# Patient Record
Sex: Female | Born: 1990 | Race: White | Hispanic: No | Marital: Single | Smoking: Never smoker
Health system: Southern US, Community
[De-identification: ages and names within clinical notes are randomized; demographics above are authoritative.]

## PROBLEM LIST (undated history)

## (undated) ENCOUNTER — Inpatient Hospital Stay (HOSPITAL_COMMUNITY): Payer: Self-pay

## (undated) DIAGNOSIS — M940 Chondrocostal junction syndrome [Tietze]: Secondary | ICD-10-CM

## (undated) DIAGNOSIS — B009 Herpesviral infection, unspecified: Secondary | ICD-10-CM

## (undated) DIAGNOSIS — F419 Anxiety disorder, unspecified: Secondary | ICD-10-CM

## (undated) DIAGNOSIS — R0789 Other chest pain: Secondary | ICD-10-CM

## (undated) DIAGNOSIS — L609 Nail disorder, unspecified: Secondary | ICD-10-CM

## (undated) DIAGNOSIS — R519 Headache, unspecified: Secondary | ICD-10-CM

## (undated) HISTORY — DX: Herpesviral infection, unspecified: B00.9

## (undated) HISTORY — DX: Headache, unspecified: R51.9

## (undated) HISTORY — DX: Anxiety disorder, unspecified: F41.9

## (undated) HISTORY — DX: Nail disorder, unspecified: L60.9

## (undated) HISTORY — DX: Other chest pain: R07.89

## (undated) HISTORY — PX: NO PAST SURGERIES: SHX2092

## (undated) HISTORY — DX: Chondrocostal junction syndrome (tietze): M94.0

---

## 2013-03-23 ENCOUNTER — Encounter (HOSPITAL_COMMUNITY): Payer: Self-pay | Admitting: Family Medicine

## 2013-03-23 ENCOUNTER — Emergency Department (HOSPITAL_COMMUNITY)
Admission: EM | Admit: 2013-03-23 | Discharge: 2013-03-24 | Disposition: A | Discharge status: Home / Self Care | Payer: BC Managed Care – PPO | Attending: Emergency Medicine | Admitting: Emergency Medicine

## 2013-03-23 DIAGNOSIS — Y939 Activity, unspecified: Secondary | ICD-10-CM | POA: Insufficient documentation

## 2013-03-23 DIAGNOSIS — S81812A Laceration without foreign body, left lower leg, initial encounter: Secondary | ICD-10-CM

## 2013-03-23 DIAGNOSIS — W1809XA Striking against other object with subsequent fall, initial encounter: Secondary | ICD-10-CM | POA: Insufficient documentation

## 2013-03-23 DIAGNOSIS — Z79899 Other long term (current) drug therapy: Secondary | ICD-10-CM | POA: Insufficient documentation

## 2013-03-23 DIAGNOSIS — Y929 Unspecified place or not applicable: Secondary | ICD-10-CM | POA: Insufficient documentation

## 2013-03-23 DIAGNOSIS — S81009A Unspecified open wound, unspecified knee, initial encounter: Secondary | ICD-10-CM | POA: Insufficient documentation

## 2013-03-24 MED ORDER — ACETAMINOPHEN 325 MG PO TABS
650.0000 mg | ORAL_TABLET | Freq: Once | ORAL | Status: AC
  Administered 2013-03-24: 650 mg via ORAL
  Filled 2013-03-24: qty 2

## 2013-03-24 NOTE — Discharge Instructions (Signed)
Take tylenol or motrin as needed for pain in your leg.  Keep wound clean and dry.  Follow up with your primary care doctor or [facility] [phone]; [address]) in 7-10 days for wound recheck and suture removal.  You should be seen sooner if you develop fever, worsening pain or redness/drainage of pus at site of wound.    Laceration Care, Adult A laceration is a cut or lesion that goes through all layers of the skin and into the tissue just beneath the skin. TREATMENT  Some lacerations may not require closure. Some lacerations may not be able to be closed due to an increased risk of infection. It is important to see your caregiver as soon as possible after an injury to minimize the risk of infection and maximize the opportunity for successful closure. If closure is appropriate, pain medicines may be given, if needed. The wound will be cleaned to help prevent infection. Your caregiver will use stitches (sutures), staples, wound glue (adhesive), or skin adhesive strips to repair the laceration. These tools bring the skin edges together to allow for faster healing and a better cosmetic outcome. However, all wounds will heal with a scar. Once the wound has healed, scarring can be minimized by covering the wound with sunscreen during the day for 1 full year. HOME CARE INSTRUCTIONS  For sutures or staples:  Keep the wound clean and dry.  If you were given a bandage (dressing), you should change it at least once a day. Also, change the dressing if it becomes wet or dirty, or as directed by your caregiver.  Wash the wound with soap and water 2 times a day. Rinse the wound off with water to remove all soap. Pat the wound dry with a clean towel.  After cleaning, apply a thin layer of the antibiotic ointment as recommended by your caregiver. This will help prevent infection and keep the dressing from sticking.  You may shower as usual after the first 24 hours. Do not soak the wound in water  until the sutures are removed.  Only take over-the-counter or prescription medicines for pain, discomfort, or fever as directed by your caregiver.  Get your sutures or staples removed as directed by your caregiver. For skin adhesive strips:  Keep the wound clean and dry.  Do not get the skin adhesive strips wet. You may bathe carefully, using caution to keep the wound dry.  If the wound gets wet, pat it dry with a clean towel.  Skin adhesive strips will fall off on their own. You may trim the strips as the wound heals. Do not remove skin adhesive strips that are still stuck to the wound. They will fall off in time. For wound adhesive:  You may briefly wet your wound in the shower or bath. Do not soak or scrub the wound. Do not swim. Avoid periods of heavy perspiration until the skin adhesive has fallen off on its own. After showering or bathing, gently pat the wound dry with a clean towel.  Do not apply liquid medicine, cream medicine, or ointment medicine to your wound while the skin adhesive is in place. This may loosen the film before your wound is healed.  If a dressing is placed over the wound, be careful not to apply tape directly over the skin adhesive. This may cause the adhesive to be pulled off before the wound is healed.  Avoid prolonged exposure to sunlight or tanning lamps while the skin adhesive  is in place. Exposure to ultraviolet light in the first year will darken the scar.  The skin adhesive will usually remain in place for 5 to 10 days, then naturally fall off the skin. Do not pick at the adhesive film. You may need a tetanus shot if:  You cannot remember when you had your last tetanus shot.  You have never had a tetanus shot. If you get a tetanus shot, your arm may swell, get red, and feel warm to the touch. This is common and not a problem. If you need a tetanus shot and you choose not to have one, there is a rare chance of getting tetanus. Sickness from tetanus can  be serious. SEEK MEDICAL CARE IF:   You have redness, swelling, or increasing pain in the wound.  You see a red line that goes away from the wound.  You have yellowish-white fluid (pus) coming from the wound.  You have a fever.  You notice a bad smell coming from the wound or dressing.  Your wound breaks open before or after sutures have been removed.  You notice something coming out of the wound such as wood or glass.  Your wound is on your hand or foot and you cannot move a finger or toe. SEEK IMMEDIATE MEDICAL CARE IF:   Your pain is not controlled with prescribed medicine.  You have severe swelling around the wound causing pain and numbness or a change in color in your arm, hand, leg, or foot.  Your wound splits open and starts bleeding.  You have worsening numbness, weakness, or loss of function of any joint around or beyond the wound.  You develop painful lumps near the wound or on the skin anywhere on your body. MAKE SURE YOU:   Understand these instructions.  Will watch your condition.  Will get help right away if you are not doing well or get worse. Document Released: 06/25/2005 Document Revised: 09/17/2011 Document Reviewed: 12/19/2010 [facility] Patient Information 2014 [place], [place].

## 2013-03-24 NOTE — ED Provider Notes (Signed)
CSN: [id number]     Arrival date & time 03/23/13  2043 History   None    Chief Complaint  Patient presents with  . Leg Injury   (Consider location/radiation/quality/duration/timing/severity/associated sxs/prior Treatment) HPI History provided by pt.   Pt stepped in a hole on campus this evening, fell forward and her L shin hit concrete step.  Sustained a laceration.  C/o moderate pain that is aggravated by palpation.  Has not attempted to bear weight.  Has not taken anything for pain or cleaned out wound.  No associated paresthesias.  Tetanus up to date.  History reviewed. No pertinent past medical history. History reviewed. No pertinent past surgical history. No family history on file. History  Substance Use Topics  . Smoking status: Never Smoker   . Smokeless tobacco: Not on file  . Alcohol Use: Yes     Comment: occasionally   OB History   Grav Para Term Preterm Abortions TAB SAB Ect Mult Living                 Review of Systems  All other systems reviewed and are negative.    Allergies  Mango flavor  Home Medications   Current Outpatient Rx  Name  Route  Sig  Dispense  Refill  . norgestimate-ethinyl estradiol (ORTHO-CYCLEN,SPRINTEC,PREVIFEM) 0.25-35 MG-MCG tablet   Oral   Take 1 tablet by mouth daily.          BP 134/82  Pulse 65  Temp(Src) 98.8 F (37.1 C) (Oral)  Resp 16  Wt 123 lb (55.792 kg)  SpO2 100%  LMP 03/09/2013 Physical Exam  Nursing note and vitals reviewed. Constitutional: She is oriented to person, place, and time. She appears well-developed and well-nourished. No distress.  HENT:  Head: Normocephalic and atraumatic.  Eyes:  Normal appearance  Neck: Normal range of motion.  Pulmonary/Chest: Effort normal.  Musculoskeletal: Normal range of motion.  L lower leg w/out deformity.  5cm vertical, superficial lac medial shin.  Debris.  Hemostatic.  Tenderness localized to this area.  No pain w/ active ROM knee/ankle.  2+ DP pulse and distal  sensation intact.  Pt able to bear weight.   Neurological: She is alert and oriented to person, place, and time.  Psychiatric: She has a normal mood and affect. Her behavior is normal.    ED Course  Procedures (including critical care time) LACERATION REPAIR Performed by: Otilio Miu Authorized by: Ruby Cola E Consent: Verbal consent obtained. Risks and benefits: risks, benefits and alternatives were discussed Consent given by: patient Patient identity confirmed: provided demographic data Prepped and Draped in normal sterile fashion Wound explored  Laceration Location: L shin  Laceration Length: 1.5cm  No Foreign Bodies seen or palpated  Anesthesia: local infiltration  Local anesthetic: lidocaine 2% w/ epinephrine  Anesthetic total: 3 ml  Irrigation method: syringe Amount of cleaning: standard  Skin closure: nylon 4.0  Number of sutures: 3  Technique: simple interrupted  Patient tolerance: Patient tolerated the procedure well with no immediate complications. 2 Labs Review Labs Reviewed - No data to display Imaging Review No results found.  MDM   1. Laceration of left leg, initial encounter    Healthy 22yo F presents w/ laceration to left shin.  Low clinical suspicion for tibia fx.  Wound irrigated by nursing staff and most proximal aspect sutured.  Received tylenol for pain. Tetanus up to date.      Otilio Miu, PA-C 03/24/13 [phone]

## 2013-03-25 NOTE — ED Provider Notes (Signed)
Medical screening examination/treatment/procedure(s) were performed by non-physician practitioner and as supervising physician I was immediately available for consultation/collaboration.  Camarion Weier R. Nicolemarie Wooley, MD 03/25/13 2027 

## 2013-04-24 ENCOUNTER — Other Ambulatory Visit: Payer: Self-pay | Admitting: Orthopedic Surgery

## 2013-04-24 ENCOUNTER — Other Ambulatory Visit: Payer: BC Managed Care – PPO

## 2013-04-24 DIAGNOSIS — M25571 Pain in right ankle and joints of right foot: Secondary | ICD-10-CM

## 2013-04-24 DIAGNOSIS — R609 Edema, unspecified: Secondary | ICD-10-CM

## 2013-04-24 DIAGNOSIS — M25572 Pain in left ankle and joints of left foot: Secondary | ICD-10-CM

## 2013-04-27 ENCOUNTER — Other Ambulatory Visit: Payer: BC Managed Care – PPO

## 2013-04-27 ENCOUNTER — Ambulatory Visit
Admission: RE | Admit: 2013-04-27 | Discharge: 2013-04-27 | Disposition: A | Discharge status: Home / Self Care | Payer: BC Managed Care – PPO | Source: Ambulatory Visit | Attending: Orthopedic Surgery | Admitting: Orthopedic Surgery

## 2013-04-27 DIAGNOSIS — M25572 Pain in left ankle and joints of left foot: Secondary | ICD-10-CM

## 2013-04-27 DIAGNOSIS — M25571 Pain in right ankle and joints of right foot: Secondary | ICD-10-CM

## 2013-04-27 DIAGNOSIS — R609 Edema, unspecified: Secondary | ICD-10-CM

## 2015-08-11 ENCOUNTER — Ambulatory Visit (INDEPENDENT_AMBULATORY_CARE_PROVIDER_SITE_OTHER): Payer: 59 | Admitting: Urgent Care

## 2015-08-11 VITALS — BP 108/70 | HR 65 | Temp 98.3°F | Resp 19 | Ht 64.0 in | Wt 132.4 lb

## 2015-08-11 DIAGNOSIS — R21 Rash and other nonspecific skin eruption: Secondary | ICD-10-CM | POA: Diagnosis not present

## 2015-08-11 DIAGNOSIS — L03811 Cellulitis of head [any part, except face]: Secondary | ICD-10-CM

## 2015-08-11 LAB — POCT CBC
Granulocyte percent: 80.3 % — AB (ref 37–80)
HCT, POC: 41.5 % (ref 37.7–47.9)
Hemoglobin: 14.3 g/dL (ref 12.2–16.2)
Lymph, poc: 1.2 (ref 0.6–3.4)
MCH, POC: 29.4 pg (ref 27–31.2)
MCHC: 34.4 g/dL (ref 31.8–35.4)
MCV: 85.4 fL (ref 80–97)
MID (cbc): 0.3 (ref 0–0.9)
MPV: 7 fL (ref 0–99.8)
POC Granulocyte: 6.5 (ref 2–6.9)
POC LYMPH PERCENT: 15.4 % (ref 10–50)
POC MID %: 4.3 % (ref 0–12)
Platelet Count, POC: 281 10*3/uL (ref 142–424)
RBC: 4.86 M/uL (ref 4.04–5.48)
RDW, POC: 12.8 %
WBC: 8.1 10*3/uL (ref 4.6–10.2)

## 2015-08-11 MED ORDER — CETIRIZINE HCL 10 MG PO TABS: 10.0000 mg | ORAL_TABLET | Freq: Every day | ORAL | Status: DC

## 2015-08-11 MED ORDER — DOXYCYCLINE HYCLATE 100 MG PO CAPS: 100.0000 mg | ORAL_CAPSULE | Freq: Two times a day (BID) | ORAL | Status: DC

## 2015-08-11 NOTE — Progress Notes (Signed)
    MRN: [id number] DOB: 05/03/1991  Subjective:   Heather Brewer is a 25 y.o. female presenting for chief complaint of Rash  Reports 2 day history of rash over her neck, associated with pain and swelling, stiff neck. Admits that she has a history of sensitive skin. 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 Denies fever, headache, confusion, congestion, cough, chest pain, shob, n/v, abdominal pain. Denies any bug bites. Patient admits history of sensitive skin.  Heather Brewer has a current medication list which includes the following prescription(s): norgestimate-ethinyl estradiol. Also is allergic to mango flavor.  Heather Brewer  has no past medical history on file. Also  has no past surgical history on file.  Objective:   Vitals: BP 108/70 mmHg  Pulse 65  Temp(Src) 98.3 F (36.8 C) (Oral)  Resp 19  Ht  (1.626 m)  Wt 132 lb 6.4 oz (60.056 kg)  BMI 22.72 kg/m2  SpO2 94%  LMP 07/29/2015  Physical Exam  Constitutional: She is oriented to person, place, and time. She appears well-developed and well-nourished.  HENT:  Head:    Mouth/Throat: Oropharynx is clear and moist.  Negative Kernig and Brudzinski sign.  Eyes: Right eye exhibits no discharge. Left eye exhibits no discharge. No scleral icterus.  Neck: Normal range of motion. Neck supple.  Cardiovascular: Normal rate, regular rhythm and intact distal pulses.  Exam reveals no gallop and no friction rub.   No murmur heard. Pulmonary/Chest: No respiratory distress. She has no wheezes. She has no rales.  Abdominal: Soft. Bowel sounds are normal. She exhibits no distension and no mass. There is no tenderness.  Musculoskeletal: She exhibits no edema.  Lymphadenopathy:    She has no cervical adenopathy.  Neurological: She is alert and oriented to person, place, and time. No cranial nerve deficit.    Results for orders placed or performed in visit on 08/11/15 (from the past 72 hour(s))    POCT CBC     Status: Abnormal   Collection Time: 08/11/15  1:47 PM  Result Value Ref Range   WBC 8.1 4.6 - 10.2 K/uL   Lymph, poc 1.2 0.6 - 3.4   POC LYMPH PERCENT 15.4 10 - 50 %L   MID (cbc) 0.3 0 - 0.9   POC MID % 4.3 0 - 12 %M   POC Granulocyte 6.5 2 - 6.9   Granulocyte percent 80.3 (A) 37 - 80 %G   RBC 4.86 4.04 - 5.48 M/uL   Hemoglobin 14.3 12.2 - 16.2 g/dL   HCT, POC 78.2 95.6 - 47.9 %   MCV 85.4 80 - 97 fL   MCH, POC 29.4 27 - 31.2 pg   MCHC 34.4 31.8 - 35.4 g/dL   RDW, POC 21.3 %   Platelet Count, POC 281 142 - 424 K/uL   MPV 7.0 0 - 99.8 fL   Assessment and Plan :   1. Rash and nonspecific skin eruption 2. Cellulitis of head except face - Patient is very anxious. She actually let me know that she had presented to a dermatologist earlier today for same problem. I counseled her on the differential. Patient agreed to trial of antibiotic to cover for cellulitis. I also recommended she start Zyrtec for antihistamine properties given her history of sensitive skin. Patient will rtc if no improvement or worsening symptoms develop.  Wallis Bamberg, PA-C [facility] [phone] 08/11/2015 1:17 PM

## 2015-08-11 NOTE — Patient Instructions (Addendum)
Cellulitis Cellulitis is an infection of the skin and the tissue beneath it. The infected area is usually red and tender. Cellulitis occurs most often in the arms and lower legs.  CAUSES  Cellulitis is caused by bacteria that enter the skin through cracks or cuts in the skin. The most common types of bacteria that cause cellulitis are staphylococci and streptococci. SIGNS AND SYMPTOMS   Redness and warmth.  Swelling.  Tenderness or pain.  Fever. DIAGNOSIS  Your health care provider can usually determine what is wrong based on a physical exam. Blood tests may also be done. TREATMENT  Treatment usually involves taking an antibiotic medicine. HOME CARE INSTRUCTIONS   Take your antibiotic medicine as directed by your health care provider. Finish the antibiotic even if you start to feel better.  Keep the infected arm or leg elevated to reduce swelling.  Apply a warm cloth to the affected area up to 4 times per day to relieve pain.  Take medicines only as directed by your health care provider.  Keep all follow-up visits as directed by your health care provider. SEEK MEDICAL CARE IF:   You notice red streaks coming from the infected area.  Your red area gets larger or turns dark in color.  Your bone or joint underneath the infected area becomes painful after the skin has healed.  Your infection returns in the same area or another area.  You notice a swollen bump in the infected area.  You develop new symptoms.  You have a fever. SEEK IMMEDIATE MEDICAL CARE IF:   You feel very sleepy.  You develop vomiting or diarrhea.  You have a general ill feeling (malaise) with muscle aches and pains.   This information is not intended to replace advice given to you by your health care provider. Make sure you discuss any questions you have with your health care provider.   Document Released: 04/04/2005 Document Revised: 03/16/2015 Document Reviewed: 09/10/2011 Elsevier Interactive  Patient Education 2016 [employer]    Doxycycline tablets or capsules What is this medicine? DOXYCYCLINE (dox i SYE kleen) is a tetracycline antibiotic. It kills certain bacteria or stops their growth. It is used to treat many kinds of infections, like dental, skin, respiratory, and urinary tract infections. It also treats acne, Lyme disease, malaria, and certain sexually transmitted infections. This medicine may be used for other purposes; ask your health care provider or pharmacist if you have questions. What should I tell my health care provider before I take this medicine? They need to know if you have any of these conditions: -liver disease -long exposure to sunlight like working outdoors -stomach problems like colitis -an unusual or allergic reaction to doxycycline, tetracycline antibiotics, other medicines, foods, dyes, or preservatives -pregnant or trying to get pregnant -breast-feeding How should I use this medicine? Take this medicine by mouth with a full glass of water. Follow the directions on the prescription label. It is best to take this medicine without food, but if it upsets your stomach take it with food. Take your medicine at regular intervals. Do not take your medicine more often than directed. Take all of your medicine as directed even if you think you are better. Do not skip doses or stop your medicine early. Talk to your pediatrician regarding the use of this medicine in children. While this drug may be prescribed for selected conditions, precautions do apply. Overdosage: If you think you have taken too much of this medicine contact a poison control center or  emergency room at once. NOTE: This medicine is only for you. Do not share this medicine with others. What if I miss a dose? If you miss a dose, take it as soon as you can. If it is almost time for your next dose, take only that dose. Do not take double or extra doses. What may interact with this  medicine? -antacids -barbiturates -birth control pills -bismuth subsalicylate -carbamazepine -methoxyflurane -other antibiotics -phenytoin -vitamins that contain iron -warfarin This list may not describe all possible interactions. Give your health care provider a list of all the medicines, herbs, non-prescription drugs, or dietary supplements you use. Also tell them if you smoke, drink alcohol, or use illegal drugs. Some items may interact with your medicine. What should I watch for while using this medicine? Tell your doctor or health care professional if your symptoms do not improve. Do not treat diarrhea with over the counter products. Contact your doctor if you have diarrhea that lasts more than 2 days or if it is severe and watery. Do not take this medicine just before going to bed. It may not dissolve properly when you lay down and can cause pain in your throat. Drink plenty of fluids while taking this medicine to also help reduce irritation in your throat. This medicine can make you more sensitive to the sun. Keep out of the sun. If you cannot avoid being in the sun, wear protective clothing and use sunscreen. Do not use sun lamps or tanning beds/booths. Birth control pills may not work properly while you are taking this medicine. Talk to your doctor about using an extra method of birth control. If you are being treated for a sexually transmitted infection, avoid sexual contact until you have finished your treatment. Your sexual partner may also need treatment. Avoid antacids, aluminum, calcium, magnesium, and iron products for 4 hours before and 2 hours after taking a dose of this medicine. If you are using this medicine to prevent malaria, you should still protect yourself from contact with mosquitos. Stay in screened-in areas, use mosquito nets, keep your body covered, and use an insect repellent. What side effects may I notice from receiving this medicine? Side effects that you  should report to your doctor or health care professional as soon as possible: -allergic reactions like skin rash, itching or hives, swelling of the face, lips, or tongue -difficulty breathing -fever -itching in the rectal or genital area -pain on swallowing -redness, blistering, peeling or loosening of the skin, including inside the mouth -severe stomach pain or cramps -unusual bleeding or bruising -unusually weak or tired -yellowing of the eyes or skin Side effects that usually do not require medical attention (report to your doctor or health care professional if they continue or are bothersome): -diarrhea -loss of appetite -nausea, vomiting This list may not describe all possible side effects. Call your doctor for medical advice about side effects. You may report side effects to FDA at 1-800-FDA-1088. Where should I keep my medicine? Keep out of the reach of children. Store at room temperature, below 30 degrees C (86 degrees F). Protect from light. Keep container tightly closed. Throw away any unused medicine after the expiration date. Taking this medicine after the expiration date can make you seriously ill. NOTE: This sheet is a summary. It may not cover all possible information. If you have questions about this medicine, talk to your doctor, pharmacist, or health care provider.    2016, Elsevier/[employer]. (2014-10-15 12:10:28)

## 2018-05-09 ENCOUNTER — Other Ambulatory Visit: Payer: Self-pay | Admitting: Obstetrics and Gynecology

## 2018-05-09 DIAGNOSIS — Z872 Personal history of diseases of the skin and subcutaneous tissue: Secondary | ICD-10-CM

## 2019-07-01 ENCOUNTER — Other Ambulatory Visit: Payer: Self-pay | Admitting: Family Medicine

## 2019-07-01 DIAGNOSIS — R599 Enlarged lymph nodes, unspecified: Secondary | ICD-10-CM

## 2019-07-01 DIAGNOSIS — F418 Other specified anxiety disorders: Secondary | ICD-10-CM

## 2019-07-01 DIAGNOSIS — R4589 Other symptoms and signs involving emotional state: Secondary | ICD-10-CM

## 2019-07-09 ENCOUNTER — Ambulatory Visit
Admission: RE | Admit: 2019-07-09 | Discharge: 2019-07-09 | Disposition: A | Discharge status: Home / Self Care | Payer: BC Managed Care – PPO | Source: Ambulatory Visit | Attending: Family Medicine | Admitting: Family Medicine

## 2019-07-09 DIAGNOSIS — F418 Other specified anxiety disorders: Secondary | ICD-10-CM

## 2019-07-09 DIAGNOSIS — R599 Enlarged lymph nodes, unspecified: Secondary | ICD-10-CM

## 2019-07-09 DIAGNOSIS — R4589 Other symptoms and signs involving emotional state: Secondary | ICD-10-CM

## 2020-06-16 IMAGING — US US SOFT TISSUE HEAD/NECK
1 series · 8 of 8 positions shown · non-contrast
Comparison: None.

CLINICAL DATA: Lymph node enlargement.

EXAM:
ULTRASOUND OF HEAD/NECK SOFT TISSUES
TECHNIQUE: Ultrasound examination of the head and neck soft tissues was
performed in the area of clinical concern.

[Series 1: us soft tissue head/neck · 0.06mm/px · 8 of 8 slices shown]
[im 1/8]
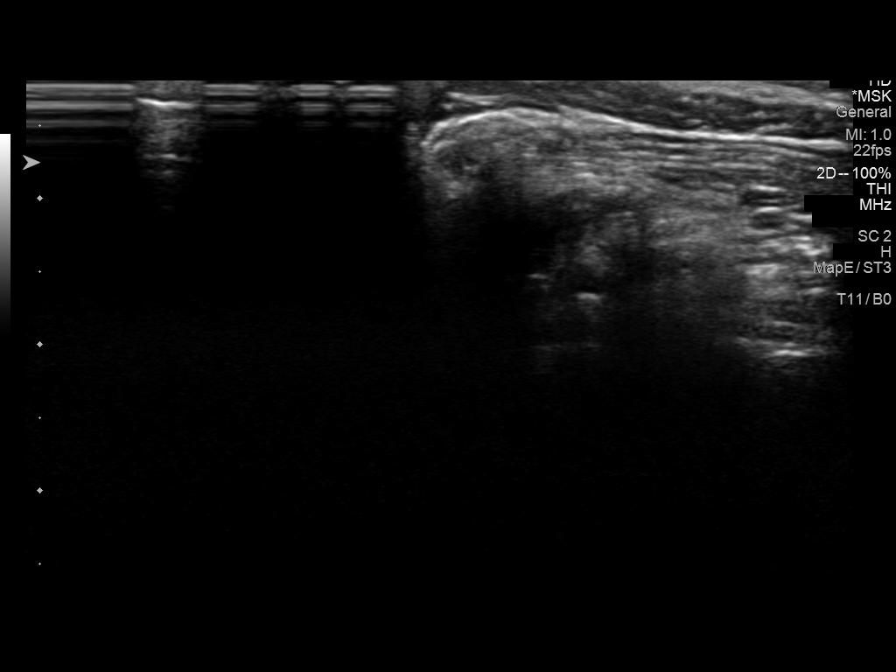
[im 2/8]
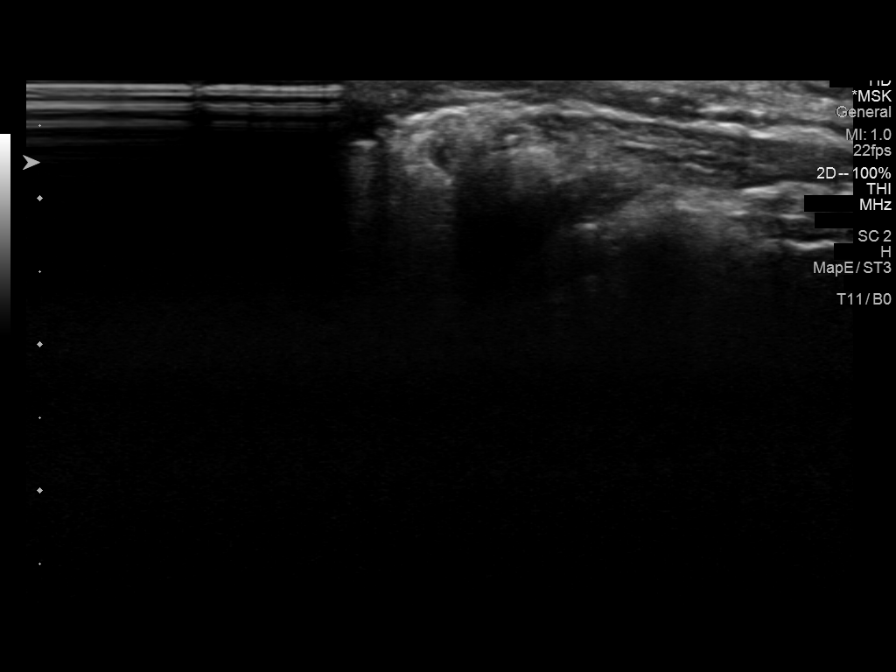
[im 3/8]
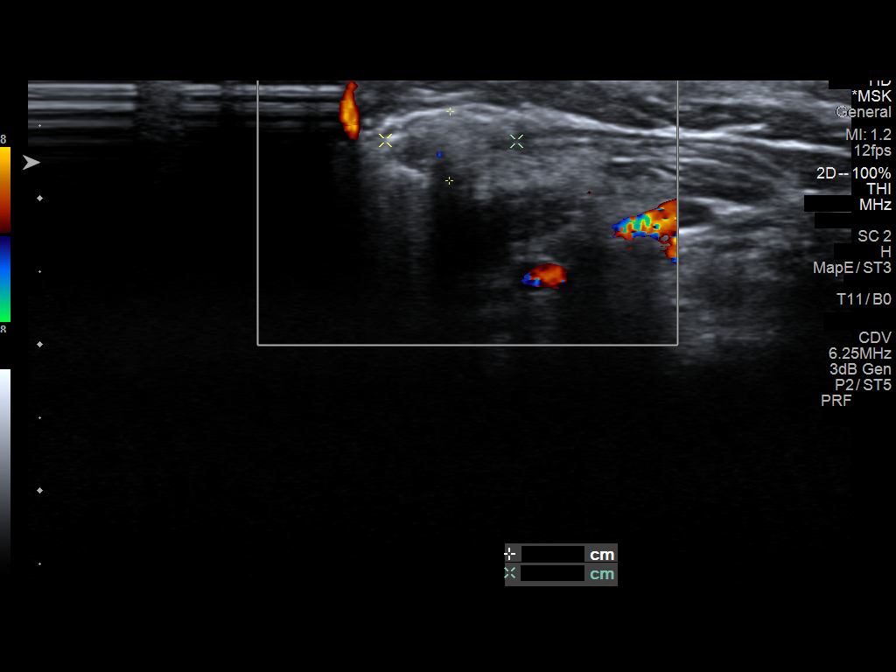
[im 4/8]
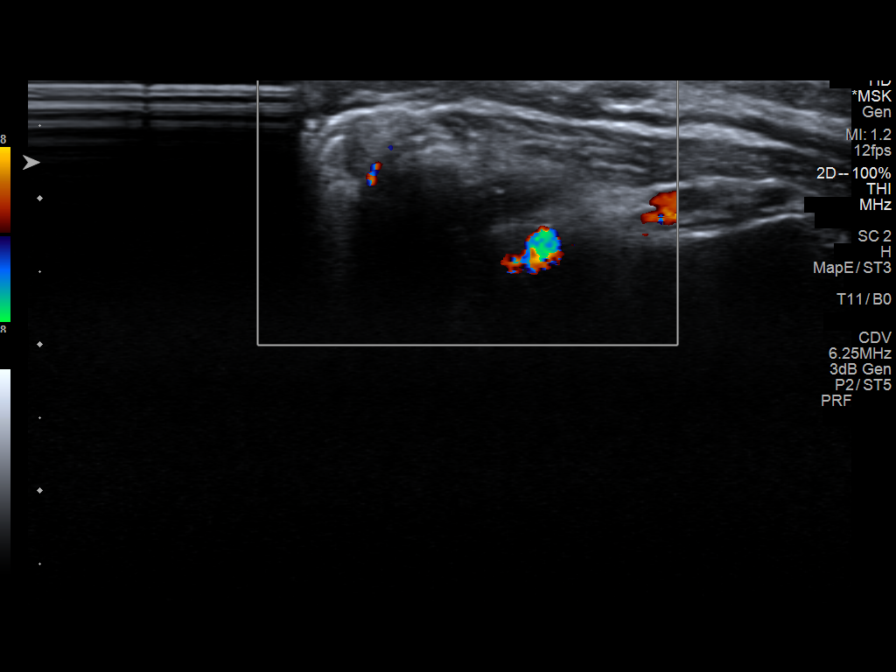
[im 5/8]
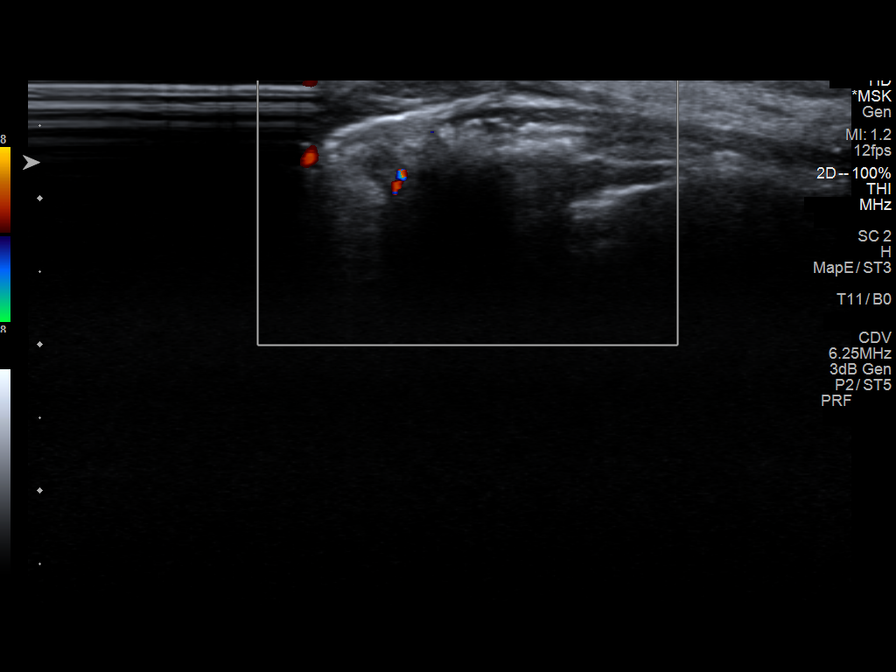
[im 6/8]
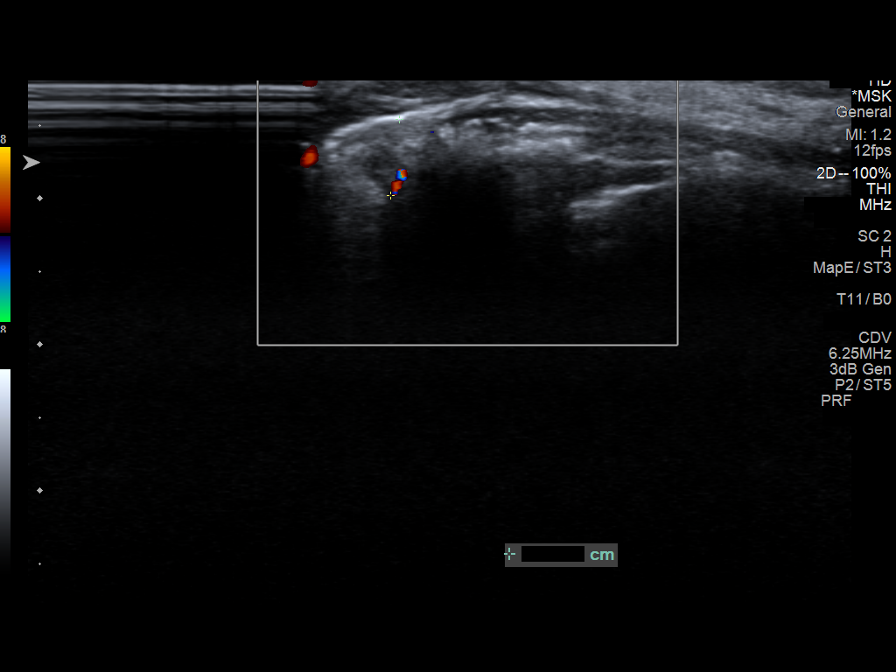
[im 7/8]
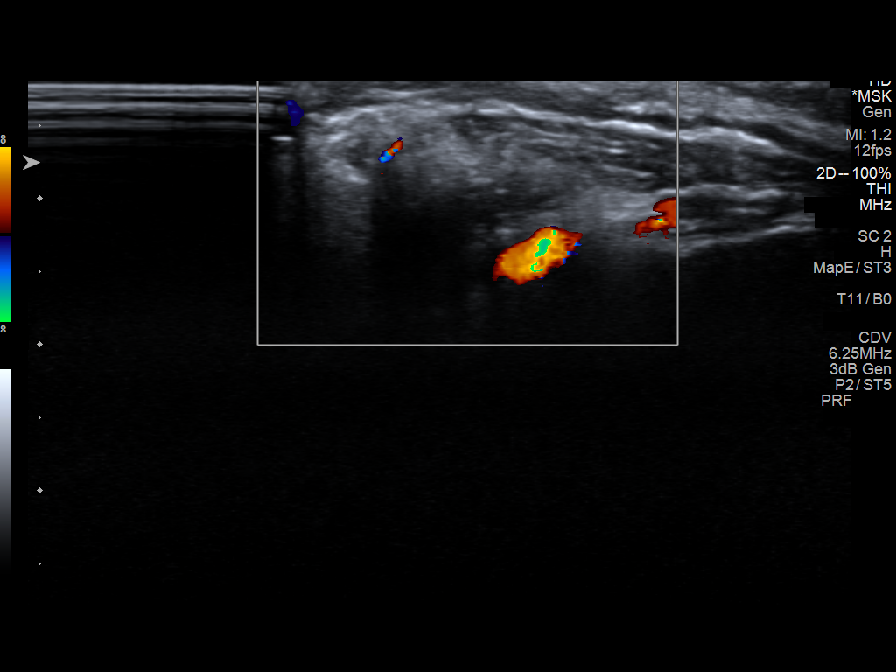
[im 8/8]
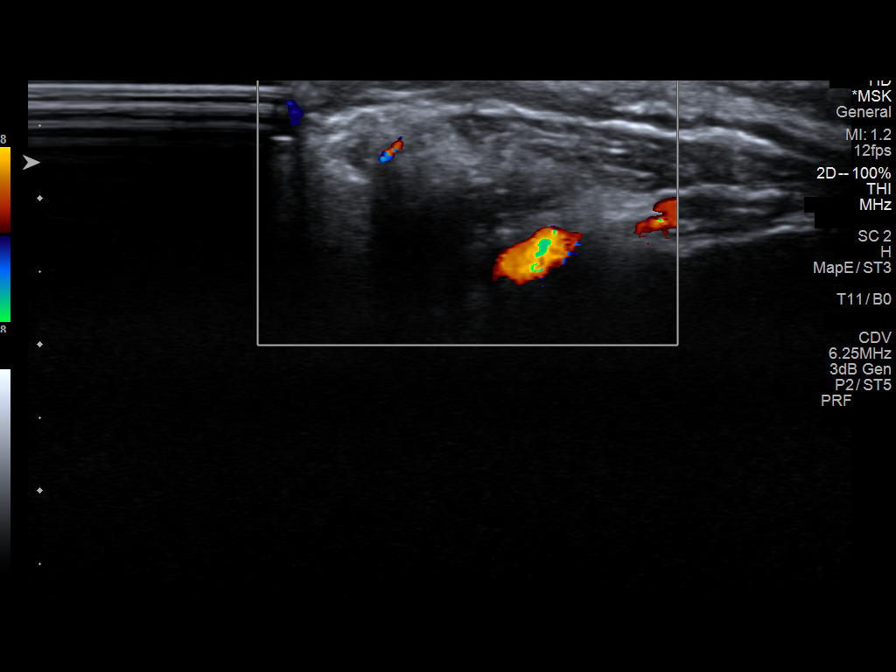

[8 of 8 positions shown; findings below may reference images not displayed]

FINDINGS: In the patient's palpable area of concern in the posterior left
auricular area, there is a small morphologically normal lymph node
measuring approximately 0.9 x 0.5 x 0.8 cm.
IMPRESSION: Normal lymph node in the patient's palpable area of concern as
detailed above.

## 2020-07-09 DIAGNOSIS — O24419 Gestational diabetes mellitus in pregnancy, unspecified control: Secondary | ICD-10-CM

## 2020-07-09 HISTORY — DX: Gestational diabetes mellitus in pregnancy, unspecified control: O24.419

## 2020-09-04 NOTE — Progress Notes (Unsigned)
Cardiology Office Note:    Date:  09/08/2020   ID:  Heather Brewer, DOB 07-Jun-1991, MRN [id number]  PCP:  [facility]  Cardiologist:  No primary care provider on file.  Advanced Practice Provider:  No care team member to display Electrophysiologist:  None    Referring MD: Daisy Floro, MD    History of Present Illness:    Heather Brewer is a 30 y.o. female with history of COVID who was referred by Dr. Tenny Craw for further evaluation of chest pain and persistent DOE.  The patient states that she had COVID 08/23/20 and she felt okay during the time of the acute infection with mild symptoms. About a week later, however, she had an episode of severe SOB and right arm discomfort and felt as if she "was having a heart attack." Symptoms lasted about 30 seconds to a couple of minutes before resolving. After this, she felt like she has been having overall chest discomfort and difficulty breathing. She saw her PCP and since that time, her symptoms improved. She has been able to resume her exercise and play indoor soccer without issues. If she develops symptoms, it is usually has been when she has been working or sitting. Notably has anxiety and this feels similar to those symptoms although she has not felt particularly anxious so she is unsure why she is having the episodes. No nausea, vomiting, lightheadedness, orthopnea or PND. She is otherwise healthy and active with no exertional symptoms.   Past Medical History:  Diagnosis Date  . Anxiety   . Chest tightness   . Costochondritis   . Nail abnormality     History reviewed. No pertinent surgical history.  Current Medications: Current Meds  Medication Sig  . Prenatal Vit-Fe Fumarate-FA (PRENATAL VITAMIN AND MINERAL) 28-0.8 MG TABS Take by mouth.     Allergies:   African mango [irvingia gabonensis]   Social History   Socioeconomic History  . Marital status: Married     Spouse name: Not on file  . Number of children: Not on file  . Years of education: Not on file  . Highest education level: Not on file  Occupational History  . Not on file  Tobacco Use  . Smoking status: Never Smoker  . Smokeless tobacco: Never Used  Substance and Sexual Activity  . Alcohol use: Yes  . Drug use: Not on file  . Sexual activity: Yes  Other Topics Concern  . Not on file  Social History Narrative  . Not on file   Social Determinants of Health   Financial Resource Strain: Not on file  Food Insecurity: Not on file  Transportation Needs: Not on file  Physical Activity: Not on file  Stress: Not on file  Social Connections: Not on file     Family History: The patient's family history includes Cancer in her paternal grandmother; Healthy in her father and mother.  ROS:   Please see the history of present illness.    Review of Systems  Constitutional: Negative for chills, fever and malaise/fatigue.  HENT: Negative for sore throat.   Eyes: Negative for blurred vision and redness.  Respiratory: Positive for shortness of breath.   Cardiovascular: Positive for chest pain. Negative for palpitations, orthopnea, claudication and leg swelling.  Gastrointestinal: Negative for nausea and vomiting.  Genitourinary: Negative for dysuria.  Musculoskeletal: Negative for falls.  Neurological: Negative for dizziness and loss of consciousness.  Endo/Heme/Allergies: Negative for polydipsia.  Psychiatric/Behavioral: Negative for substance abuse.    EKGs/Labs/Other Studies Reviewed:    The following studies were reviewed today: No cardiac studies  EKG:  EKG is  ordered today.  The ekg ordered today demonstrates sinus bradycardia with HR 59  Recent Labs: No results found for requested labs within last 8760 hours.  Recent Lipid Panel No results found for: CHOL, TRIG, HDL, CHOLHDL, VLDL, LDLCALC, LDLDIRECT   Risk Assessment/Calculations:       Physical Exam:    VS:  BP  110/70   Pulse (!) 59   Ht 5\' 4"  (1.626 m)   Wt 129 lb 12.8 oz (58.9 kg)   SpO2 99%   BMI 22.28 kg/m     Wt Readings from Last 3 Encounters:  09/08/20 129 lb 12.8 oz (58.9 kg)     GEN:  Well nourished, well developed in no acute distress HEENT: Normal NECK: No JVD; No carotid bruits CARDIAC: RRR, no murmurs, rubs, gallops RESPIRATORY:  Clear to auscultation without rales, wheezing or rhonchi  ABDOMEN: Soft, non-tender, non-distended MUSCULOSKELETAL:  No edema; No deformity  SKIN: Warm and dry NEUROLOGIC:  Alert and oriented x 3 PSYCHIATRIC:  Normal affect   ASSESSMENT:    1. Shortness of breath   2. Chest pain of uncertain etiology   3. 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 Specifically, has intermittent episodes of SOB and chest discomfort. Overall symptoms are improving since her visit with her PCP and the patient has resumed exercising and plays indoor soccer without issues. ECG reassuringly normal. Given that symptoms are improving, ECG is normal, and no exertional limitations, will continue to monitor at this time with no further cardiac work-up needed. -Continue to monitor; if symptoms progress or become exertional, can pursue further work-up at that time   Medication Adjustments/Labs and Tests Ordered: Current medicines are reviewed at length with the patient today.  Concerns regarding medicines are outlined above.  Orders Placed This Encounter  Procedures  . EKG 12-Lead   No orders of the defined types were placed in this encounter.   Patient Instructions  Please call 09/2020 if you need anything!    Signed, [place], MD  09/08/2020 10:51 AM    [facility]

## 2020-09-08 ENCOUNTER — Encounter: Payer: Self-pay | Admitting: Cardiology

## 2020-09-08 ENCOUNTER — Ambulatory Visit (INDEPENDENT_AMBULATORY_CARE_PROVIDER_SITE_OTHER): Payer: BC Managed Care – PPO | Admitting: Cardiology

## 2020-09-08 ENCOUNTER — Other Ambulatory Visit: Payer: Self-pay

## 2020-09-08 VITALS — BP 110/70 | HR 59 | Ht 64.0 in | Wt 129.8 lb

## 2020-09-08 DIAGNOSIS — U071 COVID-19: Secondary | ICD-10-CM | POA: Diagnosis not present

## 2020-09-08 DIAGNOSIS — R079 Chest pain, unspecified: Secondary | ICD-10-CM

## 2020-09-08 DIAGNOSIS — R0602 Shortness of breath: Secondary | ICD-10-CM | POA: Diagnosis not present

## 2020-09-08 NOTE — Patient Instructions (Signed)
Please call us if you need anything.

## 2021-06-30 DIAGNOSIS — M6289 Other specified disorders of muscle: Secondary | ICD-10-CM

## 2021-06-30 HISTORY — DX: Other specified disorders of muscle: M62.89

## 2021-07-07 DIAGNOSIS — O24419 Gestational diabetes mellitus in pregnancy, unspecified control: Secondary | ICD-10-CM | POA: Insufficient documentation

## 2021-07-07 DIAGNOSIS — O09299 Supervision of pregnancy with other poor reproductive or obstetric history, unspecified trimester: Secondary | ICD-10-CM | POA: Insufficient documentation

## 2021-09-02 NOTE — Care Plan (Signed)
 [facility] SOCIAL WORK PSYCHOSOCIAL ASSESSMENT  Patient: Heather Brewer MRN: [id number] Admitting Diagnosis: Admitted to labor and delivery [Z78.9]  Social Work was consulted for hx of anxiety. SW met with the patient.  Consent obtained to discuss needs that others may overhear.  Patient is alert & oriented x4  DEMOGRAPHICS:   Patient currently lives with their spouse.  Residence type is: apartment [address] [place] [address]  RESOURCES: Support System: Friends and family Religious Affiliation: N/A  Air cabin crew Supports: SW provided Pt with a [employer]. [employer] will assess. Transportation:   Husband Infant Supplies: Pt stated that she has a car seat, crib, and a bassinet for baby. SW explained the risks of co-sleeping; Pt verbalized understanding. MEDICAL:  ADL Prior to Admission: Independent   Outpatient Care Providers:  Pt stated that she was seen by [facility] for prenatal care. SW explained that baby will need to be seen 1-2 days post dc. Behavioral Health Provider: None       Durable Medical Equipment (DME):    None Skilled Nursing Facility (SNF) History: N/A Home Health Care:     None Outpatient Dialysis:  N/A  EDUCATIONAL & FINANCIAL:   Educational History: N/A Occupational History: Research scientist (medical) Status: Patient is employed by [employer]   Prescription Coverage: Yes Health Insurance:           Primary Coverage: AVMED - AVMED HMO     Secondary Coverage:  -           Tertiary Coverage:  -   SAFETY: Patient denies any history of sexual/emotional/physical abuse; SW did not ask as husband was present. Patient denies any history of domestic violence; SW did not ask as husband was present. Patient denies any history of substance abuse Patient reports a psychiatric history, anxiety Patient denies suicidal ideations DCF involvement this hospitalization? No  SOCIAL WORKER PLAN:  Assessment:  SW met with Pt at bedside. Pt's husband was  present. Pt gave SW verbal consent to continue with the assessment. Pt stated that she lives with her husband and now baby. Pt received prenatal care. Pt has supplies for baby. Pt is employed. SW was consulted for anxiety. Pt stated that she has a emotional support dog and she mentioned it to her doctor. Pt stated that she is not on meds or in therapy. Pt explained that she copes by working out. Pt denied baker act history. Pt denied SI/HI and current depression. SW spoke to Pt about PPD and provided Pt with PPD resources.  Plan:  BABY IS OK TO DC HOME WITH MOTHER ONCE MEDICALLY CLEARED.  Patient Goals:  D/C home when medically cleared. Patient Strengths:  Pt has support, supplies, and funding.  PHYSICIAN PLAN:   D/C home when medically cleared.    BARRIERS TO DISCHARGE:  Pending medical clearance.  Length of interview/visit:  25 minutes Delon Massing, MSW [phone]

## 2022-10-16 DIAGNOSIS — L089 Local infection of the skin and subcutaneous tissue, unspecified: Secondary | ICD-10-CM | POA: Diagnosis not present

## 2022-10-16 DIAGNOSIS — L821 Other seborrheic keratosis: Secondary | ICD-10-CM | POA: Diagnosis not present

## 2022-10-16 DIAGNOSIS — D224 Melanocytic nevi of scalp and neck: Secondary | ICD-10-CM | POA: Diagnosis not present

## 2022-10-16 DIAGNOSIS — D225 Melanocytic nevi of trunk: Secondary | ICD-10-CM | POA: Diagnosis not present

## 2022-10-16 DIAGNOSIS — L814 Other melanin hyperpigmentation: Secondary | ICD-10-CM | POA: Diagnosis not present

## 2022-10-30 DIAGNOSIS — R4589 Other symptoms and signs involving emotional state: Secondary | ICD-10-CM | POA: Insufficient documentation

## 2022-10-30 DIAGNOSIS — K6289 Other specified diseases of anus and rectum: Secondary | ICD-10-CM | POA: Diagnosis not present

## 2022-10-30 DIAGNOSIS — F411 Generalized anxiety disorder: Secondary | ICD-10-CM | POA: Insufficient documentation

## 2022-10-30 DIAGNOSIS — K625 Hemorrhage of anus and rectum: Secondary | ICD-10-CM | POA: Diagnosis not present

## 2022-10-30 HISTORY — DX: Other symptoms and signs involving emotional state: R45.89

## 2022-10-31 ENCOUNTER — Ambulatory Visit: Payer: BC Managed Care – PPO | Admitting: Physician Assistant

## 2022-10-31 ENCOUNTER — Encounter: Payer: Self-pay | Admitting: Physician Assistant

## 2022-10-31 VITALS — BP 102/60 | HR 60 | Temp 97.1°F | Ht 64.5 in | Wt 144.0 lb

## 2022-10-31 DIAGNOSIS — Z8632 Personal history of gestational diabetes: Secondary | ICD-10-CM

## 2022-10-31 DIAGNOSIS — Z136 Encounter for screening for cardiovascular disorders: Secondary | ICD-10-CM | POA: Diagnosis not present

## 2022-10-31 DIAGNOSIS — F411 Generalized anxiety disorder: Secondary | ICD-10-CM

## 2022-10-31 DIAGNOSIS — Z0000 Encounter for general adult medical examination without abnormal findings: Secondary | ICD-10-CM

## 2022-10-31 DIAGNOSIS — Z131 Encounter for screening for diabetes mellitus: Secondary | ICD-10-CM

## 2022-10-31 DIAGNOSIS — Z13220 Encounter for screening for lipoid disorders: Secondary | ICD-10-CM | POA: Diagnosis not present

## 2022-10-31 LAB — CBC WITH DIFFERENTIAL/PLATELET
Basophils Absolute: 0 K/uL (ref 0.0–0.1)
Basophils Relative: 0.7 % (ref 0.0–3.0)
Eosinophils Absolute: 0.1 K/uL (ref 0.0–0.7)
Eosinophils Relative: 1.4 % (ref 0.0–5.0)
HCT: 42.9 % (ref 36.0–46.0)
Hemoglobin: 14.5 g/dL (ref 12.0–15.0)
Lymphocytes Relative: 20.6 % (ref 12.0–46.0)
Lymphs Abs: 1.2 K/uL (ref 0.7–4.0)
MCHC: 33.7 g/dL (ref 30.0–36.0)
MCV: 87 fl (ref 78.0–100.0)
Monocytes Absolute: 0.4 K/uL (ref 0.1–1.0)
Monocytes Relative: 7.3 % (ref 3.0–12.0)
Neutro Abs: 4.2 K/uL (ref 1.4–7.7)
Neutrophils Relative %: 70 % (ref 43.0–77.0)
Platelets: 292 K/uL (ref 150.0–400.0)
RBC: 4.93 Mil/uL (ref 3.87–5.11)
RDW: 13.2 % (ref 11.5–15.5)
WBC: 6 K/uL (ref 4.0–10.5)

## 2022-10-31 LAB — COMPREHENSIVE METABOLIC PANEL WITH GFR
ALT: 15 U/L (ref 0–35)
AST: 20 U/L (ref 0–37)
Albumin: 4.5 g/dL (ref 3.5–5.2)
Alkaline Phosphatase: 94 U/L (ref 39–117)
BUN: 18 mg/dL (ref 6–23)
CO2: 28 meq/L (ref 19–32)
Calcium: 9.6 mg/dL (ref 8.4–10.5)
Chloride: 103 meq/L (ref 96–112)
Creatinine, Ser: 0.88 mg/dL (ref 0.40–1.20)
GFR: 88.45 mL/min (ref >60.00)
Glucose, Bld: 101 mg/dL — ABNORMAL HIGH (ref 70–99)
Potassium: 3.9 meq/L (ref 3.5–5.1)
Sodium: 141 meq/L (ref 135–145)
Total Bilirubin: 0.5 mg/dL (ref 0.2–1.2)
Total Protein: 7.1 g/dL (ref 6.0–8.3)

## 2022-10-31 LAB — LIPID PANEL
Cholesterol: 132 mg/dL (ref 0–200)
HDL: 48.6 mg/dL (ref >39.00)
LDL Cholesterol: 68 mg/dL (ref 0–99)
NonHDL: 83.25
Total CHOL/HDL Ratio: 3
Triglycerides: 74 mg/dL (ref 0.0–149.0)
VLDL: 14.8 mg/dL (ref 0.0–40.0)

## 2022-10-31 LAB — HEMOGLOBIN A1C: Hgb A1c MFr Bld: 5.5 % (ref 4.6–6.5)

## 2022-10-31 LAB — TSH: TSH: 1.05 u[IU]/mL (ref 0.35–5.50)

## 2022-10-31 NOTE — Progress Notes (Signed)
Subjective:    Heather Brewer is a 32 y.o. female and is here for a comprehensive physical exam.  HPI  There are no preventive care reminders to display for this patient.  Acute Concerns: None  Chronic Issues: Anxiety At its worst during the pandemic. Still has some situational stress related to child rearing and working. Overall doing well and denies need for medication or talk therapy.  History of Gestational Diabetes Started in week 28. Recorded blood glucose regularly and reports only a few were outside normal range. Did not require any medication. Denies hypertension, iron problems during her pregnancy.  Taking prenatal multivitamin, omega 3. Not taking birth control or planning to become pregnant. Denies hemiparesis, leg edema.  Health Maintenance: Immunizations -- UTD on flu vaccine.  Colonoscopy -- Scheduled for 12/2022. Mammogram -- N/A PAP -- Last completed 02/19/22. Negative for intraepithelial lesion or malignancy. Requesting referral for gynecologist. Bone Density -- N/A Diet -- Eats healthy. Sometimes skips meals. Exercise -- Plays soccer.  Sleep habits -- Stable. Sometimes gets to bed late. Mood -- Stable.  UTD with dentist? - UTD. Requesting recommendations. UTD with eye doctor? - Seen on an as needed basis.  Weight history: Wt Readings from Last 10 Encounters:  10/31/22 144 lb (65.3 kg)  09/08/20 129 lb 12.8 oz (58.9 kg)   Body mass index is 24.34 kg/m. Patient's last menstrual period was 10/26/2022 (exact date).  Alcohol use:  reports that she does not currently use alcohol.  Tobacco use:  Tobacco Use: Low Risk  (10/31/2022)   Patient History    Smoking Tobacco Use: Never    Smokeless Tobacco Use: Never    Passive Exposure: Not on file   Eligible for lung cancer screening? No     10/31/2022    8:29 AM  Depression screen PHQ 2/9  Decreased Interest 0  Down, Depressed, Hopeless 0  PHQ - 2 Score 0  Altered sleeping 0  Tired, decreased  energy 1  Change in appetite 0  Feeling bad or failure about yourself  1  Trouble concentrating 0  Moving slowly or fidgety/restless 0  Suicidal thoughts 0  PHQ-9 Score 2  Difficult doing work/chores Somewhat difficult     Other providers/specialists: Patient Care Team: Jarold Motto, [place] as PCP - General (Physician Assistant)    PMHx, SurgHx, SocialHx, Medications, and Allergies were reviewed in the Visit Navigator and updated as appropriate.   Past Medical History:  Diagnosis Date   Anxiety    Chest tightness    Gestational diabetes 2022   checked blood sugars regularly, no medications   Nail abnormality    pale, ridges at time, nail-biter   Vaginal delivery 08/2021    History reviewed. No pertinent surgical history.   Family History  Problem Relation Age of Onset   Healthy Mother    Healthy Father    Cancer Paternal Grandmother        SKIN   Breast cancer Neg Hx    Colon cancer Neg Hx     Social History   Tobacco Use   Smoking status: Never   Smokeless tobacco: Never  Vaping Use   Vaping Use: Never used  Substance Use Topics   Alcohol use: Not Currently   Drug use: Never    Review of Systems:   Review of Systems  Constitutional:  Negative for chills, fever, malaise/fatigue and weight loss.  HENT:  Negative for hearing loss, sinus pain and sore throat.   Respiratory:  Negative for cough,  hemoptysis and shortness of breath.   Cardiovascular:  Negative for chest pain, palpitations, leg swelling and PND.  Gastrointestinal:  Negative for abdominal pain, constipation, diarrhea, heartburn, nausea and vomiting.  Genitourinary:  Negative for dysuria, frequency and urgency.  Musculoskeletal:  Negative for back pain, myalgias and neck pain.  Skin:  Negative for itching and rash.  Neurological:  Negative for dizziness, tingling, seizures and headaches.       (-) Hemiparesis  Endo/Heme/Allergies:  Negative for polydipsia.  Psychiatric/Behavioral:  Negative  for depression. The patient is not nervous/anxious.     Objective:   BP 102/60 (BP Location: Left Arm, Patient Position: Sitting, Cuff Size: Normal)   Pulse 60   Temp (!) 97.1 F (36.2 C) (Temporal)   Ht 5' 4.5" (1.638 m)   Wt 144 lb (65.3 kg)   LMP 10/26/2022 (Exact Date)   SpO2 98%   BMI 24.34 kg/m  Body mass index is 24.34 kg/m.   General Appearance:    Alert, cooperative, no distress, appears stated age  Head:    Normocephalic, without obvious abnormality, atraumatic  Eyes:    PERRL, conjunctiva/corneas clear, EOM's intact, fundi    benign, both eyes  Ears:    Normal TM's and external ear canals, both ears  Nose:   Nares normal, septum midline, mucosa normal, no drainage    or sinus tenderness  Throat:   Lips, mucosa, and tongue normal; teeth and gums normal  Neck:   Supple, symmetrical, trachea midline, no adenopathy;    thyroid:  no enlargement/tenderness/nodules; no carotid   bruit or JVD  Back:     Symmetric, no curvature, ROM normal, no CVA tenderness  Lungs:     Clear to auscultation bilaterally, respirations unlabored  Chest Wall:    No tenderness or deformity   Heart:    Regular rate and rhythm, S1 and S2 normal, no murmur, rub or gallop  Breast Exam:    Deferred   Abdomen:     Soft, non-tender, bowel sounds active all four quadrants,    no masses, no organomegaly  Genitalia:    Deferred   Extremities:   Extremities normal, atraumatic, no cyanosis or edema  Pulses:   2+ and symmetric all extremities  Skin:   Skin color, texture, turgor normal, no rashes or lesions  Lymph nodes:   Cervical, supraclavicular, and axillary nodes normal  Neurologic:   CNII-XII intact, normal strength, sensation and reflexes    throughout    Assessment/Plan:   Routine physical examination Today patient counseled on age appropriate routine health concerns for screening and prevention, each reviewed and up to date or declined. Immunizations reviewed and up to date or declined. Labs  ordered and reviewed. Risk factors for depression reviewed and negative. Hearing function and visual acuity are intact. ADLs screened and addressed as needed. Functional ability and level of safety reviewed and appropriate. Education, counseling and referrals performed based on assessed risks today. Patient provided with a copy of personalized plan for preventive services.   History of gestational diabetes Update A1c and provide recommendations accordingly Would benefit from yearly A1c screenings due to this history   Encounter for lipid screening for cardiovascular disease Update lipid panel and provide recommendations accordingly   Generalized anxiety disorder Well controlled per patient Continue to monitor Recommend that she reach out if any concerns     I,Alexander Ruley,acting as a scribe for [employer], PA.,have documented all relevant documentation on the behalf of Jarold Motto, PA,as directed by  Jarold Motto, PA while in the presence of Jarold Motto, [place].   I, Jarold Motto, [place], have reviewed all documentation for this visit. The documentation on 10/31/22 for the exam, diagnosis, procedures, and orders are all accurate and complete.    Jarold Motto, PA-C [facility]

## 2022-10-31 NOTE — Patient Instructions (Addendum)
It was great to see you! ? ?Please go to the lab for blood work.  ? ?Our office will call you with your results unless you have chosen to receive results via MyChart. ? ?If your blood work is normal we will follow-up each year for physicals and as scheduled for chronic medical problems. ? ?If anything is abnormal we will treat accordingly and get you in for a follow-up. ? ?Take care, ? ?Kieren Adkison ?  ? ? ?

## 2023-01-08 DIAGNOSIS — R1032 Left lower quadrant pain: Secondary | ICD-10-CM | POA: Diagnosis not present

## 2023-01-08 DIAGNOSIS — N926 Irregular menstruation, unspecified: Secondary | ICD-10-CM | POA: Diagnosis not present

## 2023-03-25 NOTE — Progress Notes (Signed)
Heather Brewer is a 32 y.o. female here for a new problem.  History of Present Illness:   No chief complaint on file.   HPI Abdominal Discomfort (Lower): Complains of lower abdominal discomfort that began *** ago.  ***  ***  ***   Past Medical History:  Diagnosis Date   Anxiety    Chest tightness    Gestational diabetes 2022   checked blood sugars regularly, no medications   Nail abnormality    pale, ridges at time, nail-biter   Vaginal delivery 08/2021     Social History   Tobacco Use   Smoking status: Never   Smokeless tobacco: Never  Vaping Use   Vaping status: Never Used  Substance Use Topics   Alcohol use: Not Currently   Drug use: Never    No past surgical history on file.  Family History  Problem Relation Age of Onset   Healthy Mother    Healthy Father    Cancer Paternal Grandmother        SKIN   Breast cancer Neg Hx    Colon cancer Neg Hx     Allergies  Allergen Reactions   Mangifera Indica Hives   African Mango [Irvingia [place]onensis]     Current Medications:   Current Outpatient Medications:    Omega-3 Fatty Acids (FISH OIL) 1000 MG CAPS, Take 1 capsule by mouth daily in the afternoon., Disp: , Rfl:    Prenatal Vit-Fe Fumarate-FA (PRENATAL VITAMIN AND MINERAL) 28-0.8 MG TABS, Take by mouth., Disp: , Rfl:    Review of Systems:   ROS  Vitals:   There were no vitals filed for this visit.   There is no height or weight on file to calculate BMI.  Physical Exam:   Physical Exam  Assessment and Plan:   There are no diagnoses linked to this encounter.         I,Emily Lagle,acting as a Neurosurgeon for [employer], PA.,have documented all relevant documentation on the behalf of Jarold Motto, PA,as directed by  Jarold Motto, PA while in the presence of Jarold Motto, [place].  *** (refresh reminder)  I, Larey Brick, have reviewed all documentation for this visit. The documentation on 03/25/23 for the exam, diagnosis,  procedures, and orders are all accurate and complete.  Jarold Motto, PA-C

## 2023-03-26 ENCOUNTER — Encounter: Payer: Self-pay | Admitting: Physician Assistant

## 2023-03-26 ENCOUNTER — Ambulatory Visit: Payer: BC Managed Care – PPO | Admitting: Physician Assistant

## 2023-03-26 VITALS — BP 110/70 | HR 61 | Temp 97.3°F | Ht 64.5 in | Wt 146.0 lb

## 2023-03-26 DIAGNOSIS — R140 Abdominal distension (gaseous): Secondary | ICD-10-CM | POA: Diagnosis not present

## 2023-03-26 DIAGNOSIS — L814 Other melanin hyperpigmentation: Secondary | ICD-10-CM | POA: Diagnosis not present

## 2023-03-26 DIAGNOSIS — G8929 Other chronic pain: Secondary | ICD-10-CM | POA: Diagnosis not present

## 2023-03-26 DIAGNOSIS — L400 Psoriasis vulgaris: Secondary | ICD-10-CM | POA: Diagnosis not present

## 2023-03-26 DIAGNOSIS — R1032 Left lower quadrant pain: Secondary | ICD-10-CM

## 2023-03-26 DIAGNOSIS — D225 Melanocytic nevi of trunk: Secondary | ICD-10-CM | POA: Diagnosis not present

## 2023-03-26 NOTE — Patient Instructions (Signed)
It was great to see you!  [address]75 CT Abdomen and Pelvis with contrast material 64332 & [id number] MR Abdomen and Pelvis with and without contrast  A referral has been placed for you to see one of our fantastic providers at [facility]. Someone from their office will be in touch soon regarding scheduling your appointment.  Their location:  [facility] at [facility]  [address] on the 1st floor Phone number [phone] Fax [phone].   This location is across the street from the entrance to [employer] and in the same complex as the [facility]  Consider IB gard as discussed for bloating  Take care,  Jarold Motto PA-C

## 2023-03-28 NOTE — Progress Notes (Signed)
He[facility] Brew[facility] [address] [place] [address] Phone: [phone]   Assessment and Plan:     1. Chronic LLQ pain 2. 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  Handout provided - Start meloxicam 15 mg daily x2 weeks.  If still having pain after 2 weeks, complete 3rd-week of meloxicam. May use remaining meloxicam as needed once daily for pain control.  Do not to use additional NSAIDs while taking meloxicam.  May use Tylenol [phone] mg 2 to 3 times a day for breakthrough pain.   15 additional minutes spent for educating Therapeutic Home Exercise Program.  This included exercises focusing on stretching, strengthening, with focus on eccentric aspects.   Long term goals include an improvement in range of motion, strength, endurance as well as avoiding reinjury. Patient's frequency would include in 1-2 times a day, 3-5 times a week for a duration of 6-12 weeks. Proper technique shown and discussed handout in great detail with ATC.  All questions were discussed and answered.     Pertinent previous records reviewed include family medicine note 03/26/2023   Follow Up: 3 weeks for reevaluation.  If no improvement or worsening of symptoms, could consider ultrasound versus x-ray versus MRI abdomen pelvis   Subjective:   I, Heather Brewer, am serving as a Neurosurgeon for Doctor Richardean Sale  Chief Complaint: abdominal pain   HPI:   03/29/2023 Patient is a 32 year old female  complaining of abdominal pain. Patient states that she has had abdominal pain since July. Has seen OBGYN. Notes severe pain resolved around July 4 . Reports that a couple of months ago she was doing a lot of personal training and thought she had pulled an abdominal muscle. States she took a couple of weeks off to rest but discomfort did not improve. States discomfort is exacerbated by certain motions. "Feels like something is bouncing up and down" when she is running, and is extremely noticeable when doing exercises where leg lifts to abdomen. She states the pain feel like a lower abdominal strain.     Relevant Historical Information: None pertinent  Additional pertinent review of systems negative.  Current Outpatient Medications  Medication Sig Dispense Refill   meloxicam (MOBIC) 15 MG tablet Take 1 tablet (15 mg total) by mouth daily. 30 tablet 0   Omega-3 Fatty Acids (FISH OIL) 1000 MG CAPS Take 1 capsule by mouth daily in the afternoon.     Prenatal Vit-Fe Fumarate-FA (PRENATAL VITAMIN AND MINERAL) 28-0.8 MG TABS Take by mouth.     No current facility-administered medications for this visit.      Objective:     Vitals:   03/29/23 1003  BP: 108/78  Pulse: 70  SpO2: 96%  Weight: 147 lb (66.7 kg)  Height: 5\' 4"  (1.626 m)      Body mass index is 25.23 kg/m.    Physical Exam:     General: awake, alert, and oriented no acute distress, nontoxic Skin: no suspicious lesions or rashes Neuro:sensation  intact distally with no deficits, normal muscle tone, no atrophy, strength 5/5 in all tested lower ext groups Psych: normal mood and affect, speech clear   Left hip: No deformity, swelling or wasting ROM Flexion 90, ext 30, IR 45, ER 45 TTP left superior pubic rami, proximal adductor's, distal abdominal wall NTTP over the hip flexors, greater trochanter, gluteal musculature, si joint, lumbar spine Negative log roll with FROM Negative FABER Negative FADIR Double and single-leg squat  without pain Negative trendelenberg Gait normal  Thomas test 1.5 fingerbreadths on left, 0.5 on right Discomfort with eccentric loading of left hip flexors not present on right Left-sided groin discomfort with resisted sit up  Electronically signed by:  Heather Brewer [facility] 10:51 AM 03/29/23

## 2023-03-29 ENCOUNTER — Ambulatory Visit: Payer: BC Managed Care – PPO | Admitting: Sports Medicine

## 2023-03-29 VITALS — BP 108/78 | HR 70 | Ht 64.0 in | Wt 147.0 lb

## 2023-03-29 DIAGNOSIS — S3981XA Other specified injuries of abdomen, initial encounter: Secondary | ICD-10-CM

## 2023-03-29 DIAGNOSIS — G8929 Other chronic pain: Secondary | ICD-10-CM | POA: Diagnosis not present

## 2023-03-29 DIAGNOSIS — R1032 Left lower quadrant pain: Secondary | ICD-10-CM

## 2023-03-29 MED ORDER — MELOXICAM 15 MG PO TABS: 15.0000 mg | ORAL_TABLET | Freq: Every day | ORAL | 0 refills | Status: DC

## 2023-03-29 NOTE — Patient Instructions (Signed)
Avoid activity that reproduces pain for 1-2 weeks - Start meloxicam 15 mg daily x2 weeks.  If still having pain after 2 weeks, complete 3rd-week of meloxicam. May use remaining meloxicam as needed once daily for pain control.  Do not to use additional NSAIDs while taking meloxicam.  May use Tylenol [phone] mg 2 to 3 times a day for breakthrough pain. Sports hernia HEP  3 week follow up

## 2023-04-04 ENCOUNTER — Other Ambulatory Visit: Payer: Self-pay | Admitting: Physician Assistant

## 2023-04-04 DIAGNOSIS — G8929 Other chronic pain: Secondary | ICD-10-CM

## 2023-04-08 ENCOUNTER — Other Ambulatory Visit: Payer: Self-pay | Admitting: Physician Assistant

## 2023-04-08 DIAGNOSIS — G8929 Other chronic pain: Secondary | ICD-10-CM

## 2023-04-09 DIAGNOSIS — K625 Hemorrhage of anus and rectum: Secondary | ICD-10-CM | POA: Diagnosis not present

## 2023-04-09 DIAGNOSIS — R109 Unspecified abdominal pain: Secondary | ICD-10-CM | POA: Diagnosis not present

## 2023-04-09 DIAGNOSIS — R194 Change in bowel habit: Secondary | ICD-10-CM | POA: Diagnosis not present

## 2023-04-10 ENCOUNTER — Other Ambulatory Visit: Payer: Self-pay | Admitting: Nurse Practitioner

## 2023-04-10 DIAGNOSIS — R1013 Epigastric pain: Secondary | ICD-10-CM

## 2023-04-12 ENCOUNTER — Encounter: Payer: Self-pay | Admitting: Physician Assistant

## 2023-04-19 ENCOUNTER — Ambulatory Visit: Payer: BC Managed Care – PPO | Admitting: Sports Medicine

## 2023-04-19 ENCOUNTER — Ambulatory Visit
Admission: RE | Admit: 2023-04-19 | Discharge: 2023-04-19 | Disposition: A | Discharge status: Home / Self Care | Payer: BC Managed Care – PPO | Source: Ambulatory Visit | Attending: Physician Assistant | Admitting: Physician Assistant

## 2023-04-19 ENCOUNTER — Other Ambulatory Visit: Payer: BC Managed Care – PPO

## 2023-04-19 DIAGNOSIS — G8929 Other chronic pain: Secondary | ICD-10-CM

## 2023-04-19 DIAGNOSIS — R1032 Left lower quadrant pain: Secondary | ICD-10-CM | POA: Diagnosis not present

## 2023-04-23 DIAGNOSIS — H6123 Impacted cerumen, bilateral: Secondary | ICD-10-CM | POA: Diagnosis not present

## 2023-04-23 DIAGNOSIS — H60503 Unspecified acute noninfective otitis externa, bilateral: Secondary | ICD-10-CM | POA: Diagnosis not present

## 2023-04-25 ENCOUNTER — Other Ambulatory Visit: Payer: BC Managed Care – PPO

## 2023-04-29 ENCOUNTER — Telehealth: Payer: Self-pay | Admitting: Physician Assistant

## 2023-04-29 NOTE — Telephone Encounter (Signed)
Patient requests to be called to discuss MRI results received on MyChart

## 2023-04-30 NOTE — Telephone Encounter (Signed)
Spoke to pt asked her how can I help you regarding MRI results? Pt said everything is good she read Samantha's response to her results and saw that it was norma. Pt said she is not having any pain right now, but will keep [place] posted and seeing [facility] wants results of MRI does she contact [facility] and have them send copy to GI. Told her yes, that would be best. Pt verbalized understanding.

## 2023-05-02 ENCOUNTER — Other Ambulatory Visit: Payer: BC Managed Care – PPO

## 2023-07-10 NOTE — L&D Delivery Note (Signed)
   Delivery Note:   G2P1001 at [redacted]w[redacted]d  Admitting diagnosis: Normal labor [O80, Z37.9] Risks: Multiparous Onset of labor: Spontaneous IOL/Augmentation: N/A ROM: SROM at home.   Complete dilation at 03/12/2024  03:55 Onset of pushing at 0355 FHR second stage Cat 1 via IA  Analgesia/Anesthesia intrapartum:Local Pushing in hands and knees and side-lying position with CNM and L&D staff support, husband present for birth and supportive.  Pt repeated asking for help between pushes and needed directing between ctx to maintain fetal descent. After strong maternal effort head began to crown and was born without restitution. Moment of delay given for fetal restitution w/o success, shoulder dystocia was called and pt was helped to McRoberts and SP pressure given. Anterior shoulder delivered and posterior arm followed along with remainder of infant with some difficulty. Good fetal tone a delivery but poor respiratory effort at 1-minute. Cord clamped and cut for infant to be attended to by baby nurse. Cry and respirations heard from warmer.   Delivery of a Live born female  Birth Weight:   APGAR: 6, 9  Newborn Delivery   Time head delivered: 03/12/2024 04:08:00 Birth date/time: 03/12/2024 04:09:24 Delivery type: Vaginal, Spontaneous     in cephalic presentation, position OA to ROA.  APGAR:1 min-6 , 5 min-9   Nuchal Cord: No  Cord double clamped after cessation of pulsation, cut by CNM at bedside.  Collection of cord blood for typing completed. Arterial cord blood sample-No   Placenta delivered-Spontaneous with 3 vessels. Uterotonics: IM pitocin .  Placenta to L&D. Uterine tone firm.   Bleeding moderate, small trickle with lower uterine manual sweep to clear clots.   2nd degree laceration identified.  Episiotomy:None Local analgesia: Lidocaine  for repair.   Repair: Est. Blood Loss (mL):0.00  Complications: Shoulder dystocia, < 1-minute relieved by SP and McRoberts.   Mom to postpartum.   Baby girl Deidre to Couplet care / Skin to Skin.  Delivery Report:  Review the Delivery Report for details.    [name] Both, CNM, MSN 03/12/2024, 5:25 AM

## 2023-07-18 DIAGNOSIS — Z3200 Encounter for pregnancy test, result unknown: Secondary | ICD-10-CM | POA: Diagnosis not present

## 2023-07-18 DIAGNOSIS — Z3689 Encounter for other specified antenatal screening: Secondary | ICD-10-CM | POA: Diagnosis not present

## 2023-07-30 DIAGNOSIS — O209 Hemorrhage in early pregnancy, unspecified: Secondary | ICD-10-CM | POA: Diagnosis not present

## 2023-08-09 DIAGNOSIS — R350 Frequency of micturition: Secondary | ICD-10-CM | POA: Diagnosis not present

## 2023-08-09 DIAGNOSIS — O209 Hemorrhage in early pregnancy, unspecified: Secondary | ICD-10-CM | POA: Diagnosis not present

## 2023-08-30 DIAGNOSIS — Z118 Encounter for screening for other infectious and parasitic diseases: Secondary | ICD-10-CM | POA: Diagnosis not present

## 2023-08-30 DIAGNOSIS — Z3481 Encounter for supervision of other normal pregnancy, first trimester: Secondary | ICD-10-CM | POA: Diagnosis not present

## 2023-08-30 DIAGNOSIS — Z3689 Encounter for other specified antenatal screening: Secondary | ICD-10-CM | POA: Diagnosis not present

## 2023-09-12 DIAGNOSIS — D225 Melanocytic nevi of trunk: Secondary | ICD-10-CM | POA: Diagnosis not present

## 2023-09-12 DIAGNOSIS — L700 Acne vulgaris: Secondary | ICD-10-CM | POA: Diagnosis not present

## 2023-09-12 DIAGNOSIS — D2261 Melanocytic nevi of right upper limb, including shoulder: Secondary | ICD-10-CM | POA: Diagnosis not present

## 2023-09-12 DIAGNOSIS — D2262 Melanocytic nevi of left upper limb, including shoulder: Secondary | ICD-10-CM | POA: Diagnosis not present

## 2023-09-12 DIAGNOSIS — L720 Epidermal cyst: Secondary | ICD-10-CM | POA: Diagnosis not present

## 2023-10-01 DIAGNOSIS — M9901 Segmental and somatic dysfunction of cervical region: Secondary | ICD-10-CM | POA: Diagnosis not present

## 2023-10-01 DIAGNOSIS — M5382 Other specified dorsopathies, cervical region: Secondary | ICD-10-CM | POA: Diagnosis not present

## 2023-10-01 DIAGNOSIS — M9903 Segmental and somatic dysfunction of lumbar region: Secondary | ICD-10-CM | POA: Diagnosis not present

## 2023-10-01 DIAGNOSIS — M5386 Other specified dorsopathies, lumbar region: Secondary | ICD-10-CM | POA: Diagnosis not present

## 2023-10-16 DIAGNOSIS — R1032 Left lower quadrant pain: Secondary | ICD-10-CM | POA: Diagnosis not present

## 2023-11-01 ENCOUNTER — Encounter: Payer: BC Managed Care – PPO | Admitting: Physician Assistant

## 2023-11-11 DIAGNOSIS — O26872 Cervical shortening, second trimester: Secondary | ICD-10-CM | POA: Diagnosis not present

## 2023-11-11 DIAGNOSIS — Z363 Encounter for antenatal screening for malformations: Secondary | ICD-10-CM | POA: Diagnosis not present

## 2023-11-11 DIAGNOSIS — Z3A21 21 weeks gestation of pregnancy: Secondary | ICD-10-CM | POA: Diagnosis not present

## 2023-11-14 ENCOUNTER — Other Ambulatory Visit: Payer: Self-pay | Admitting: *Deleted

## 2023-11-14 ENCOUNTER — Other Ambulatory Visit: Payer: Self-pay | Admitting: Certified Nurse Midwife

## 2023-11-14 DIAGNOSIS — O09299 Supervision of pregnancy with other poor reproductive or obstetric history, unspecified trimester: Secondary | ICD-10-CM | POA: Insufficient documentation

## 2023-11-14 DIAGNOSIS — O26872 Cervical shortening, second trimester: Secondary | ICD-10-CM | POA: Insufficient documentation

## 2023-11-14 DIAGNOSIS — O26879 Cervical shortening, unspecified trimester: Secondary | ICD-10-CM

## 2023-11-16 ENCOUNTER — Inpatient Hospital Stay (HOSPITAL_COMMUNITY)
Admission: AD | Admit: 2023-11-16 | Discharge: 2023-11-16 | Disposition: A | Discharge status: Home / Self Care | Attending: Obstetrics | Admitting: Obstetrics

## 2023-11-16 ENCOUNTER — Encounter (HOSPITAL_COMMUNITY): Payer: Self-pay | Admitting: Obstetrics

## 2023-11-16 ENCOUNTER — Inpatient Hospital Stay (HOSPITAL_BASED_OUTPATIENT_CLINIC_OR_DEPARTMENT_OTHER)

## 2023-11-16 DIAGNOSIS — R102 Pelvic and perineal pain: Secondary | ICD-10-CM | POA: Diagnosis not present

## 2023-11-16 DIAGNOSIS — O26892 Other specified pregnancy related conditions, second trimester: Secondary | ICD-10-CM

## 2023-11-16 DIAGNOSIS — O26872 Cervical shortening, second trimester: Secondary | ICD-10-CM | POA: Diagnosis not present

## 2023-11-16 DIAGNOSIS — Z3A21 21 weeks gestation of pregnancy: Secondary | ICD-10-CM | POA: Diagnosis not present

## 2023-11-16 DIAGNOSIS — O3432 Maternal care for cervical incompetence, second trimester: Secondary | ICD-10-CM

## 2023-11-16 LAB — URINALYSIS, ROUTINE W REFLEX MICROSCOPIC
Bilirubin Urine: NEGATIVE
Glucose, UA: NEGATIVE mg/dL
Hgb urine dipstick: NEGATIVE
Ketones, ur: NEGATIVE mg/dL
Leukocytes,Ua: NEGATIVE
Nitrite: NEGATIVE
Protein, ur: NEGATIVE mg/dL
Specific Gravity, Urine: 1.003 — ABNORMAL LOW (ref 1.005–1.030)
pH: 7 (ref 5.0–8.0)

## 2023-11-16 NOTE — MAU Provider Note (Signed)
 MAU Provider Note  Chief Complaint: vaginal pressure  SUBJECTIVE HPI: Heather Brewer is a 33 y.o. G2P1000 at [redacted]w[redacted]d by LMP who presents to maternity admissions reporting diagnosed with a short cervix on her anatomy scan last week (2.3cm). She reports this morning she has felt an increase in lower abdominal pressure and pelvic pressure. She was standing in the shower this morning and stuck her fingers inside of herself because she was feeling "different" and felt her cervix is very low. When she placed her progesterone last night she felt like it was normal. Denies recent intercourse. No VB or LOF. +FM. No pain.  Pregnancy c/b cervical insufficiency.   Receives [facility] with [facility].  HPI  Past Medical History:  Diagnosis Date   Anxiety    Anxiety about health 10/30/2022   Chest tightness    Gestational diabetes 2022   checked blood sugars regularly, no medications   Headache    Herpes    Nail abnormality    pale, ridges at time, nail-biter   Pelvic floor dysfunction 06/30/2021   Formatting of this note might be different from the original.  06/30/2021: Had triage visit for concern for PTL earlier in the month. Was ruled out. Eventually saw PFPT and diagnosed with pelvic floor dysfunction. Plan is to continue following with PFPT. -Nathan Guerette, CNM     Vaginal delivery 08/2021   History reviewed. No pertinent surgical history. Social History   Socioeconomic History   Marital status: Married    Spouse name: Not on file   Number of children: 1   Years of education: Not on file   Highest education level: Not on file  Occupational History   Not on file  Tobacco Use   Smoking status: Never   Smokeless tobacco: Never  Vaping Use   Vaping status: Never Used  Substance and Sexual Activity   Alcohol use: Not Currently   Drug use: Never   Sexual activity: Not Currently    Birth control/protection: None  Other Topics Concern   Not on file  Social History Narrative   Work  two part time jobs   Daughter 14 months    Married   Social Drivers of Corporate investment banker Strain: Not on Ship broker Insecurity: Not on file  Transportation Needs: Not on file  Physical Activity: Not on file  Stress: Not on file  Social Connections: Not on file  Intimate Partner Violence: Not on file   No current facility-administered medications on file prior to encounter.   Current Outpatient Medications on File Prior to Encounter  Medication Sig Dispense Refill   Omega-3 Fatty Acids (FISH OIL) 1000 MG CAPS Take 1 capsule by mouth daily in the afternoon.     Prenatal Vit-Fe Fumarate-FA (PRENATAL VITAMIN AND MINERAL) 28-0.8 MG TABS Take by mouth.     progesterone 200 MG SUPP Place 200 mg vaginally at bedtime.     meloxicam  (MOBIC ) 15 MG tablet Take 1 tablet (15 mg total) by mouth daily. 30 tablet 0   Allergies  Allergen Reactions   Mangifera Indica Hives   African Mango [Irvingia Gabonensis]     ROS:  Pertinent positives/negatives listed above.  I have reviewed patient's Past Medical Hx, Surgical Hx, Family Hx, Social Hx, medications and allergies.   Physical Exam  Patient Vitals for the past 24 hrs:  BP Temp Temp src Pulse Resp SpO2 Height Weight  11/16/23 1148 (!) 111/58 -- -- 69 (P) 16 (P) 99 % -- --  11/16/23 1105 114/62 98 F (36.7 C) Oral 81 14 100 % 5\' 4"  (1.626 m) 69.5 kg   Constitutional: Well-developed, well-nourished female in no acute distress  Cardiovascular: normal rate Respiratory: normal effort GI: Abd soft, non-tender MS: Extremities nontender, no edema, normal ROM Neurologic: Alert and oriented x 4  GU: Neg CVAT.  PELVIC EXAM: Cervix pink-purple, visually closed, ectropion at 12 o'clock, otherwise without lesion, scant white creamy discharge, vaginal walls and external genitalia normal  FHT:  150  LAB RESULTS Results for orders placed or performed during the hospital encounter of 11/16/23 (from the past 24 hours)  Urinalysis, Routine w  reflex microscopic -Urine, Clean Catch     Status: Abnormal   Collection Time: 11/16/23 11:11 AM  Result Value Ref Range   Color, Urine STRAW (A) YELLOW   APPearance CLEAR CLEAR   Specific Gravity, Urine 1.003 (L) 1.005 - 1.030   pH 7.0 5.0 - 8.0   Glucose, UA NEGATIVE NEGATIVE mg/dL   Hgb urine dipstick NEGATIVE NEGATIVE   Bilirubin Urine NEGATIVE NEGATIVE   Ketones, ur NEGATIVE NEGATIVE mg/dL   Protein, ur NEGATIVE NEGATIVE mg/dL   Nitrite NEGATIVE NEGATIVE   Leukocytes,Ua NEGATIVE NEGATIVE      IMAGING No results found.  MAU Management/MDM: Orders Placed This Encounter  Procedures   US  MFM OB Transvaginal   Urinalysis, Routine w reflex microscopic -Urine, Clean Catch   Discharge patient Discharge disposition: 01-Home or Self Care; Discharge patient date: 11/16/2023    No orders of the defined types were placed in this encounter.    Available prenatal records reviewed.  ASSESSMENT 1. [redacted] weeks gestation of pregnancy   2. Cervical insufficiency during pregnancy in second trimester, antepartum   Cervix posterior, closed on SSE. Last US  2.3cm. Started on progesterone Friday.   US  ordered for cervical length 2.5 cm  Reemphasized pelvic rest.  Continue treatment with   PLAN Discharge home with strict return precautions. Continue prenatal care as scheduled.  Allergies as of 11/16/2023       Reactions   Mangifera Indica Hives   African Mango [irvingia Gabonensis]         Medication List     TAKE these medications    Fish Oil 1000 MG Caps Take 1 capsule by mouth daily in the afternoon.   meloxicam  15 MG tablet Commonly known as: MOBIC  Take 1 tablet (15 mg total) by mouth daily.   Prenatal Vitamin and Mineral 28-0.8 MG Tabs Take by mouth.   progesterone 200 MG Supp Place 200 mg vaginally at bedtime.        Follow-up Information     [facility], [facility] Follow up.   Why: As scheduled for prenatal care Contact information: [address] [place]  [place] [address] [phone]                [place], MD FMOB Fellow, Faculty practice [facility], Center for [facility]  11/16/2023  1:40 PM

## 2023-11-16 NOTE — MAU Note (Signed)
..  Heather Brewer is a 33 y.o. at 107w5d here in MAU reporting: diagnoses with a short cervix on her anatomy scan last week (2.3cm). She reports this morning she has felt an increase in lower abdominal pressure and pelvic pressure. She was standing in the shower this morning and stuck her fingers inside of herself because she was feeling "different" and felt her cervix is very low. When she placed her progesterone last night she felt like it was normal. Denies recent intercourse. No VB or LOF. +FM. No pain.   Pain score: 0 Vitals:   11/16/23 1105  BP: 114/62  Pulse: 81  Resp: 14  Temp: 98 F (36.7 C)  SpO2: 100%     FHT:150 Lab orders placed from triage:   UA

## 2023-11-21 ENCOUNTER — Ambulatory Visit: Attending: Obstetrics and Gynecology | Admitting: Maternal & Fetal Medicine

## 2023-11-21 ENCOUNTER — Ambulatory Visit (HOSPITAL_BASED_OUTPATIENT_CLINIC_OR_DEPARTMENT_OTHER)

## 2023-11-21 ENCOUNTER — Telehealth: Payer: Self-pay

## 2023-11-21 VITALS — BP 117/59 | HR 67

## 2023-11-21 DIAGNOSIS — R102 Pelvic and perineal pain: Secondary | ICD-10-CM | POA: Diagnosis not present

## 2023-11-21 DIAGNOSIS — Z3A22 22 weeks gestation of pregnancy: Secondary | ICD-10-CM

## 2023-11-21 DIAGNOSIS — O26872 Cervical shortening, second trimester: Secondary | ICD-10-CM | POA: Insufficient documentation

## 2023-11-21 DIAGNOSIS — O26892 Other specified pregnancy related conditions, second trimester: Secondary | ICD-10-CM | POA: Insufficient documentation

## 2023-11-21 DIAGNOSIS — O09292 Supervision of pregnancy with other poor reproductive or obstetric history, second trimester: Secondary | ICD-10-CM

## 2023-11-21 DIAGNOSIS — O26879 Cervical shortening, unspecified trimester: Secondary | ICD-10-CM

## 2023-11-21 NOTE — Progress Notes (Unsigned)
 Patient information  Patient Name: Heather Brewer  Patient MRN:   [id number]  Referring practice: MFM Referring Provider: Retta Caster  MFM CONSULT  Heather Brewer is a 33 y.o. G2P1001 at [redacted]w[redacted]d here for ultrasound and consultation. Patient Active Problem List   Diagnosis Date Noted   History of preterm premature rupture of membranes (PROM) in previous pregnancy, currently pregnant 11/14/2023   Short cervical length during pregnancy, second trimester 11/14/2023   Generalized anxiety disorder 10/30/2022   History of gestational diabetes in prior pregnancy, currently pregnant 07/07/2021   [name] is doing well today with no acute concerns.   The patient was referred to a short cervix during this pregnancy.  The patient reports that her previous pregnancy she delivered at 68 but had signs of labor the day prior.  She noticed during that pregnancy her pelvis was very uncomfortable and it felt her cervix was "going to fall out."  The same sensation happened during this pregnancy and a cervical length was performed showing mild cervical shortening. She is fearful of urinating and sitting down due to this sensation and uses Around 21 weeks her cervical length was 2.3 to 2.7 cm and she was given vaginal progesterone and has done well with that but still has this pressure sensation.  She is modified her activities to help accommodate her symptoms.  I encouraged her that her cervical length is stable today and that I think it is unlikely that it would shorten to the degree where a cerclage would be considered.  I gave her the choice of a follow-up ultrasound in 1 week versus clinical symptom monitoring and the patient elected to forego the ultrasound in favor of symptom monitoring.  She did report that she has another ultrasound next week that she may elect to keep at her OB providers office.  I discussed that I typically do not recommend serial transvaginal cervical lengths past 24 weeks because there is  no treatment available other than hospitalization and steroids but typically symptoms and cervical checks are sufficient to determine if this is needed.  Sonographic findings Single intrauterine pregnancy at 22w 3d  Fetal cardiac activity:  Observed and appears normal. Presentation: Cephalic. The anatomic structures that were well seen appear normal but were limited based on today's exam. Amniotic fluid: Within normal limits.  MVP: 6.63 cm. Placenta: Anterior. Adnexa: No abnormality visualized. Cervical length: 2.92 cm.  There are limitations of prenatal ultrasound such as the inability to detect certain abnormalities due to poor visualization. Various factors such as fetal position, gestational age and maternal body habitus may increase the difficulty in visualizing the fetal anatomy.    Recommendations -The patient decided to follow up with her referring providers and monitor symptoms for preterm birth.  -Since the cervical length is stable on progesterone serial cervical lengths are not recommended unless there is concern for preterm labor. Beyond 24 weeks we typically do not recommend cervical length screening if the patient is already receiving the treatment of a short cervix and cerclage would not be offered (at >24 weeks). However, a cervical length with FFN has often been used in the late second and early trimester to stratify the risk of preterm birth decided which patient may benefit from admission and betamethasone. The patient will discuss this with her OB provider to decide on a plan she is comfortable with.   Review of Systems: A review of systems was performed and was negative except per HPI   Vitals and Physical Exam  11/21/2023    2:02 PM 11/16/2023    1:49 PM 11/16/2023   11:48 AM  Vitals with BMI  Systolic 117 106 161  Diastolic 59 63 58  Pulse 67 63 69    Sitting comfortably on the sonogram table Nonlabored breathing Normal rate and rhythm Abdomen is  nontender  Past pregnancies OB History  Gravida Para Term Preterm AB Living  2 1 1   1   SAB IAB Ectopic Multiple Live Births          # Outcome Date GA Lbr Len/2nd Weight Sex Type Anes PTL Lv  2 Current           1 Term 09/01/21 [redacted]w[redacted]d    Vag-Spont       I spent 60 minutes reviewing the patients chart, including labs and images as well as counseling the patient about her medical conditions. Greater than 50% of the time was spent in direct face-to-face patient counseling.  Penney Bowling  MFM, [facility]   11/22/2023  10:18 AM

## 2023-12-10 DIAGNOSIS — Z3A25 25 weeks gestation of pregnancy: Secondary | ICD-10-CM | POA: Diagnosis not present

## 2023-12-10 DIAGNOSIS — O26872 Cervical shortening, second trimester: Secondary | ICD-10-CM | POA: Diagnosis not present

## 2023-12-23 DIAGNOSIS — Z3A27 27 weeks gestation of pregnancy: Secondary | ICD-10-CM | POA: Diagnosis not present

## 2023-12-23 DIAGNOSIS — Z1331 Encounter for screening for depression: Secondary | ICD-10-CM | POA: Diagnosis not present

## 2023-12-23 DIAGNOSIS — O26872 Cervical shortening, second trimester: Secondary | ICD-10-CM | POA: Diagnosis not present

## 2023-12-23 DIAGNOSIS — Z833 Family history of diabetes mellitus: Secondary | ICD-10-CM | POA: Diagnosis not present

## 2023-12-23 DIAGNOSIS — Z3689 Encounter for other specified antenatal screening: Secondary | ICD-10-CM | POA: Diagnosis not present

## 2023-12-25 ENCOUNTER — Other Ambulatory Visit (HOSPITAL_COMMUNITY): Payer: Self-pay | Admitting: Obstetrics

## 2023-12-25 ENCOUNTER — Ambulatory Visit (HOSPITAL_COMMUNITY)
Admission: RE | Admit: 2023-12-25 | Discharge: 2023-12-25 | Disposition: A | Discharge status: Home / Self Care | Source: Ambulatory Visit | Attending: Obstetrics | Admitting: Obstetrics

## 2023-12-25 DIAGNOSIS — M79662 Pain in left lower leg: Secondary | ICD-10-CM

## 2024-01-06 DIAGNOSIS — Z3689 Encounter for other specified antenatal screening: Secondary | ICD-10-CM | POA: Diagnosis not present

## 2024-02-06 DIAGNOSIS — Z77011 Contact with and (suspected) exposure to lead: Secondary | ICD-10-CM | POA: Diagnosis not present

## 2024-02-07 DIAGNOSIS — Z77011 Contact with and (suspected) exposure to lead: Secondary | ICD-10-CM | POA: Diagnosis not present

## 2024-02-14 DIAGNOSIS — R1031 Right lower quadrant pain: Secondary | ICD-10-CM | POA: Diagnosis not present

## 2024-02-14 DIAGNOSIS — O26873 Cervical shortening, third trimester: Secondary | ICD-10-CM | POA: Diagnosis not present

## 2024-02-14 DIAGNOSIS — Z3A34 34 weeks gestation of pregnancy: Secondary | ICD-10-CM | POA: Diagnosis not present

## 2024-02-17 DIAGNOSIS — Z3685 Encounter for antenatal screening for Streptococcus B: Secondary | ICD-10-CM | POA: Diagnosis not present

## 2024-02-17 DIAGNOSIS — Z3483 Encounter for supervision of other normal pregnancy, third trimester: Secondary | ICD-10-CM | POA: Diagnosis not present

## 2024-02-17 DIAGNOSIS — Z833 Family history of diabetes mellitus: Secondary | ICD-10-CM | POA: Diagnosis not present

## 2024-02-19 DIAGNOSIS — H6123 Impacted cerumen, bilateral: Secondary | ICD-10-CM | POA: Diagnosis not present

## 2024-02-19 DIAGNOSIS — H9201 Otalgia, right ear: Secondary | ICD-10-CM | POA: Diagnosis not present

## 2024-02-19 DIAGNOSIS — O26893 Other specified pregnancy related conditions, third trimester: Secondary | ICD-10-CM | POA: Diagnosis not present

## 2024-02-19 DIAGNOSIS — H60311 Diffuse otitis externa, right ear: Secondary | ICD-10-CM | POA: Diagnosis not present

## 2024-02-20 DIAGNOSIS — H6691 Otitis media, unspecified, right ear: Secondary | ICD-10-CM | POA: Diagnosis not present

## 2024-02-20 DIAGNOSIS — O99891 Other specified diseases and conditions complicating pregnancy: Secondary | ICD-10-CM | POA: Diagnosis not present

## 2024-02-20 DIAGNOSIS — H60311 Diffuse otitis externa, right ear: Secondary | ICD-10-CM | POA: Diagnosis not present

## 2024-03-07 ENCOUNTER — Inpatient Hospital Stay (HOSPITAL_COMMUNITY)
Admission: AD | Admit: 2024-03-07 | Discharge: 2024-03-07 | Disposition: A | Discharge status: Home / Self Care | Attending: Obstetrics and Gynecology | Admitting: Obstetrics and Gynecology

## 2024-03-07 ENCOUNTER — Encounter (HOSPITAL_COMMUNITY): Payer: Self-pay | Admitting: Obstetrics and Gynecology

## 2024-03-07 DIAGNOSIS — Z0371 Encounter for suspected problem with amniotic cavity and membrane ruled out: Secondary | ICD-10-CM | POA: Insufficient documentation

## 2024-03-07 DIAGNOSIS — Z3A37 37 weeks gestation of pregnancy: Secondary | ICD-10-CM | POA: Insufficient documentation

## 2024-03-07 DIAGNOSIS — Z3689 Encounter for other specified antenatal screening: Secondary | ICD-10-CM

## 2024-03-07 DIAGNOSIS — Z3493 Encounter for supervision of normal pregnancy, unspecified, third trimester: Secondary | ICD-10-CM

## 2024-03-07 DIAGNOSIS — N898 Other specified noninflammatory disorders of vagina: Secondary | ICD-10-CM | POA: Diagnosis present

## 2024-03-07 LAB — RUPTURE OF MEMBRANE (ROM)PLUS: Rom Plus: NEGATIVE

## 2024-03-07 LAB — POCT FERN TEST: POCT Fern Test: NEGATIVE

## 2024-03-07 NOTE — MAU Note (Signed)
 Heather Brewer is a 33 y.o. at [redacted]w[redacted]d here in MAU reporting: choked on some water and started coughing aggressively and felt a whole bunch of liquid came out said it was clear, watery, and odorless. Has not continued to leak since then. Denies any VB. Reports irregular ctxs. Reports +FM Has not had cervical exam yet and does not want one if not necessary.  Onset of complaint: today Pain score: 0 Vitals:   03/07/24 1100  BP: 127/80  Pulse: 84  Resp: 15  Temp: 97.7 F (36.5 C)  SpO2: 98%     FHT:155 Lab orders placed from triage:  [name]

## 2024-03-07 NOTE — MAU Provider Note (Signed)
 S: Ms. Heather Brewer is a 33 y.o. G2P1001 at [redacted]w[redacted]d  who presents to MAU today complaining of leaking of fluid since around 0930 when she choked on water, coughed hard and noted a LOF she's unsure if it was amniotic fluid or urine. She denies vaginal bleeding. She denies contractions. She reports normal fetal movement.    O: BP 125/79 (BP Location: Right Arm)   Pulse 77   Temp 97.7 F (36.5 C) (Oral)   Resp 18   Ht 5' 4 (1.626 m)   Wt 181 lb 3.2 oz (82.2 kg)   LMP 06/17/2023   SpO2 98%   BMI 31.10 kg/m  GENERAL: Well-developed, well-nourished female in no acute distress.  HEAD: Normocephalic, atraumatic.  CHEST: Normal effort of breathing, regular heart rate ABDOMEN: Soft, nontender, gravid PELVIC: Normal external female genitalia. Vagina is pink and rugated. Cervix with normal contour, no lesions. Normal physiologic discharge.  No pooling.   Cervical exam: visually closed   Fetal Monitoring: reactive Baseline: 145 Variability: moderate Accelerations: 15x15 Decelerations: none Contractions: UI only  Results for orders placed or performed during the hospital encounter of 03/07/24 (from the past 24 hours)  Fern Test     Status: None   Collection Time: 03/07/24 11:45 AM  Result Value Ref Range   POCT Fern Test Negative = intact amniotic membranes   Rupture of Membrane (ROM) Plus     Status: None   Collection Time: 03/07/24 12:30 PM  Result Value Ref Range   Rom Plus NEGATIVE    A: SIUP at [redacted]w[redacted]d  Membranes intact NST reactice  P: Discharge home in stable condition with term labor precautions Follow up at [facility] for ongoing prenatal care.  Vannie Cornell [place], [place] 03/07/2024 2:39 PM

## 2024-03-12 ENCOUNTER — Encounter (HOSPITAL_COMMUNITY): Payer: Self-pay

## 2024-03-12 ENCOUNTER — Inpatient Hospital Stay (HOSPITAL_COMMUNITY)
Admission: AD | Admit: 2024-03-12 | Discharge: 2024-03-13 | DRG: 807 | Disposition: A | Discharge status: Home / Self Care | Attending: Obstetrics & Gynecology | Admitting: Obstetrics & Gynecology

## 2024-03-12 DIAGNOSIS — F411 Generalized anxiety disorder: Secondary | ICD-10-CM | POA: Diagnosis present

## 2024-03-12 DIAGNOSIS — Z2882 Immunization not carried out because of caregiver refusal: Secondary | ICD-10-CM | POA: Diagnosis not present

## 2024-03-12 DIAGNOSIS — Z2821 Immunization not carried out because of patient refusal: Secondary | ICD-10-CM | POA: Diagnosis not present

## 2024-03-12 DIAGNOSIS — Z3A38 38 weeks gestation of pregnancy: Secondary | ICD-10-CM | POA: Diagnosis not present

## 2024-03-12 DIAGNOSIS — O26893 Other specified pregnancy related conditions, third trimester: Secondary | ICD-10-CM | POA: Diagnosis not present

## 2024-03-12 LAB — CBC
HCT: 38.9 % (ref 36.0–46.0)
Hemoglobin: 13.3 g/dL (ref 12.0–15.0)
MCH: 29.7 pg (ref 26.0–34.0)
MCHC: 34.2 g/dL (ref 30.0–36.0)
MCV: 86.8 fL (ref 80.0–100.0)
Platelets: 243 K/uL (ref 150–400)
RBC: 4.48 MIL/uL (ref 3.87–5.11)
RDW: 13.1 % (ref 11.5–15.5)
WBC: 20.4 K/uL — ABNORMAL HIGH (ref 4.0–10.5)
nRBC: 0 % (ref 0.0–0.2)

## 2024-03-12 LAB — TYPE AND SCREEN
ABO/RH(D): O POS
Antibody Screen: NEGATIVE

## 2024-03-12 LAB — HIV ANTIBODY (ROUTINE TESTING W REFLEX): HIV Screen 4th Generation wRfx: NONREACTIVE

## 2024-03-12 LAB — RPR: RPR Ser Ql: NONREACTIVE

## 2024-03-12 MED ORDER — FLEET ENEMA RE ENEM
1.0000 | ENEMA | RECTAL | Status: DC | PRN
Start: 2024-03-12 — End: 2024-03-12

## 2024-03-12 MED ORDER — DIBUCAINE (PERIANAL) 1 % EX OINT
1.0000 | TOPICAL_OINTMENT | CUTANEOUS | Status: DC | PRN
Start: 2024-03-12 — End: 2024-03-13

## 2024-03-12 MED ORDER — ACETAMINOPHEN 325 MG PO TABS: 650.0000 mg | ORAL_TABLET | ORAL | Status: DC | PRN

## 2024-03-12 MED ORDER — OXYTOCIN-SODIUM CHLORIDE 30-0.9 UT/500ML-% IV SOLN: 2.5000 [IU]/h | INTRAVENOUS | Status: DC

## 2024-03-12 MED ORDER — SIMETHICONE 80 MG PO CHEW: 80.0000 mg | CHEWABLE_TABLET | ORAL | Status: DC | PRN

## 2024-03-12 MED ORDER — LACTATED RINGERS IV SOLN: 500.0000 mL | INTRAVENOUS | Status: DC | PRN

## 2024-03-12 MED ORDER — SOD CITRATE-CITRIC ACID 500-334 MG/5ML PO SOLN: 30.0000 mL | ORAL | Status: DC | PRN

## 2024-03-12 MED ORDER — OXYCODONE-ACETAMINOPHEN 5-325 MG PO TABS: 2.0000 | ORAL_TABLET | ORAL | Status: DC | PRN

## 2024-03-12 MED ORDER — LACTATED RINGERS IV SOLN: INTRAVENOUS | Status: DC

## 2024-03-12 MED ORDER — LIDOCAINE HCL (PF) 1 % IJ SOLN: 30.0000 mL | INTRAMUSCULAR | Status: DC | PRN

## 2024-03-12 MED ORDER — OXYCODONE-ACETAMINOPHEN 5-325 MG PO TABS: 1.0000 | ORAL_TABLET | ORAL | Status: DC | PRN

## 2024-03-12 MED ORDER — OXYTOCIN 10 UNIT/ML IJ SOLN
INTRAMUSCULAR | Status: AC
  Administered 2024-03-12: 10 [IU]
  Filled 2024-03-12: qty 1

## 2024-03-12 MED ORDER — OXYTOCIN BOLUS FROM INFUSION: 333.0000 mL | Freq: Once | INTRAVENOUS | Status: DC

## 2024-03-12 MED ORDER — IBUPROFEN 600 MG PO TABS
600.0000 mg | ORAL_TABLET | Freq: Four times a day (QID) | ORAL | Status: DC
  Administered 2024-03-13: 600 mg via ORAL
  Filled 2024-03-12 (×3): qty 1

## 2024-03-12 MED ORDER — ONDANSETRON HCL 4 MG/2ML IJ SOLN: 4.0000 mg | INTRAMUSCULAR | Status: DC | PRN

## 2024-03-12 MED ORDER — METHYLERGONOVINE MALEATE 0.2 MG PO TABS: 0.2000 mg | ORAL_TABLET | ORAL | Status: DC | PRN

## 2024-03-12 MED ORDER — METHYLERGONOVINE MALEATE 0.2 MG/ML IJ SOLN: 0.2000 mg | INTRAMUSCULAR | Status: DC | PRN

## 2024-03-12 MED ORDER — ONDANSETRON HCL 4 MG PO TABS: 4.0000 mg | ORAL_TABLET | ORAL | Status: DC | PRN

## 2024-03-12 MED ORDER — COCONUT OIL OIL: 1.0000 | TOPICAL_OIL | Status: DC | PRN

## 2024-03-12 MED ORDER — LIDOCAINE HCL (PF) 1 % IJ SOLN
INTRAMUSCULAR | Status: AC
  Administered 2024-03-12: 30 mL
  Filled 2024-03-12: qty 30

## 2024-03-12 MED ORDER — WITCH HAZEL-GLYCERIN EX PADS: 1.0000 | MEDICATED_PAD | CUTANEOUS | Status: DC | PRN

## 2024-03-12 MED ORDER — BENZOCAINE-MENTHOL 20-0.5 % EX AERO
1.0000 | INHALATION_SPRAY | CUTANEOUS | Status: DC | PRN
  Administered 2024-03-12: 1 via TOPICAL
  Filled 2024-03-12: qty 56

## 2024-03-12 MED ORDER — ZOLPIDEM TARTRATE 5 MG PO TABS: 5.0000 mg | ORAL_TABLET | Freq: Every evening | ORAL | Status: DC | PRN

## 2024-03-12 MED ORDER — DIPHENHYDRAMINE HCL 25 MG PO CAPS: 25.0000 mg | ORAL_CAPSULE | Freq: Four times a day (QID) | ORAL | Status: DC | PRN

## 2024-03-12 MED ORDER — SENNOSIDES-DOCUSATE SODIUM 8.6-50 MG PO TABS
2.0000 | ORAL_TABLET | Freq: Every day | ORAL | Status: DC
  Administered 2024-03-13: 2 via ORAL
  Filled 2024-03-12: qty 2

## 2024-03-12 MED ORDER — ONDANSETRON HCL 4 MG/2ML IJ SOLN: 4.0000 mg | Freq: Four times a day (QID) | INTRAMUSCULAR | Status: DC | PRN

## 2024-03-12 MED ORDER — PRENATAL MULTIVITAMIN CH
1.0000 | ORAL_TABLET | Freq: Every day | ORAL | Status: DC
  Administered 2024-03-13: 1 via ORAL
  Filled 2024-03-12 (×2): qty 1

## 2024-03-12 NOTE — Progress Notes (Signed)
 To bedside to follow-up on patient postpartum s/p NSVD complicated by shoulder dystocia and precipitous delivery.  - Labs drawn postpartum reviewed. -  Pt stated she googled shoulder dystocia and was very concerned about baby girl, peds just rounded and gave reassurance to maternal and fetal wellbeing. Discussed precipitous delivery and complications for shoulder delivery with patient at bedside.  She reports understanding and is grateful she and baby are safe.  - Pr at risk for pptm mood disturbance for extreme anxiety regarding plan of care and anticipatory pain. Continue routine monitoring and consider close pptm follow-up.  - Female infant skin-to-skin and latching well at breast, to see lactation as desired.  - VSS, reviewed elevated BP upon admission to PP floor. Stable at this time.  - To continue routine pptm care at this time. Encouraged rest as baby rests. CBC    Component Value Date/Time   WBC 20.4 (H) 03/12/2024 0603   RBC 4.48 03/12/2024 0603   HGB 13.3 03/12/2024 0603   HCT 38.9 03/12/2024 0603   PLT 243 03/12/2024 0603   MCV 86.8 03/12/2024 0603   MCH 29.7 03/12/2024 0603   MCHC 34.2 03/12/2024 0603   RDW 13.1 03/12/2024 0603   LYMPHSABS 1.2 10/31/2022 0904   MONOABS 0.4 10/31/2022 0904   EOSABS 0.1 10/31/2022 0904   BASOSABS 0.0 10/31/2022 0904       03/12/2024   12:00 PM 03/12/2024    7:55 AM 03/12/2024    6:54 AM  Vitals with BMI  Systolic 113 126 855  Diastolic 84 80 85  Pulse 93 101 101

## 2024-03-12 NOTE — H&P (Signed)
 OB ADMISSION/ HISTORY & PHYSICAL:  Admission Date: 03/12/2024  3:13 AM  Admit Diagnosis: Normal labor [O80, Z37.9]    Heather Brewer is a 33 y.o. female G2P1001 at [redacted]w[redacted]d presenting for SROM, uterine contractions q3-5 minutes and intense pain. Pt reports wanting to go home and wanting not to do this. Supported by husband at bedside.   Prenatal History: G2P1001   EDC: 03/23/2024, by Last Menstrual Period  Prenatal care at [facility] since 1st trimester.  Primary:   Prenatal course complicated by: Shortened cervical length in pregnancy, multigravida and h/o GDM.  Prenatal Labs: ABO, Rh:   O, positive. Antibody:  Negative. Rubella:   immune.  RPR:   Negative.  HBsAg:   Negative.  HIV:   Negative. GBS:   Negative.  1 hr Glucola : 126mg /dL. Genetic Screening: NIPT low risk. Ultrasound: 1st trimester, CUS and 3T growth    Maternal Diabetes: No Genetic Screening: Normal Maternal Ultrasounds/Referrals: Normal and Declined Fetal Ultrasounds or other Referrals:  None Maternal Substance Abuse:  No Significant Maternal Medications:  None Significant Maternal Lab Results:  Group B Strep negative Other Comments:  None  Medical / Surgical History : Past medical history:  Past Medical History:  Diagnosis Date   Anxiety    Anxiety about health 10/30/2022   Chest tightness    Gestational diabetes 2022   checked blood sugars regularly, no medications   Headache    Herpes    Nail abnormality    pale, ridges at time, nail-biter   Pelvic floor dysfunction 06/30/2021   Formatting of this note might be different from the original.  06/30/2021: Had triage visit for concern for PTL earlier in the month. Was ruled out. Eventually saw PFPT and diagnosed with pelvic floor dysfunction. Plan is to continue following with PFPT. Barbarann Basket, CNM     Vaginal delivery 08/2021    Past surgical history:  Past Surgical History:  Procedure Laterality Date   NO PAST SURGERIES       Family History:  Family History  Problem Relation Age of Onset   Healthy Mother    Healthy Father    Cancer Paternal Grandmother        SKIN   Breast cancer Neg Hx    Colon cancer Neg Hx     Social History:  reports that she has never smoked. She has never used smokeless tobacco. She reports that she does not currently use alcohol. She reports that she does not use drugs.  Allergies: Mangifera indica and African mango [irvingia gabonensis]   Current Medications at time of admission:  Medications Prior to Admission  Medication Sig Dispense Refill Last Dose/Taking   Omega-3 Fatty Acids (FISH OIL) 1000 MG CAPS Take 1 capsule by mouth daily in the afternoon.      Prenatal Vit-Fe Fumarate-FA (PRENATAL VITAMIN AND MINERAL) 28-0.8 MG TABS Take by mouth.      progesterone 200 MG SUPP Place 200 mg vaginally at bedtime.        Review of Systems: Review of Systems  Constitutional:  Negative for chills and fever.  HENT:  Positive for sore throat.   Respiratory:  Negative for cough.   Cardiovascular:  Negative for chest pain and palpitations.  Gastrointestinal:  Negative for nausea and vomiting.  Neurological:  Negative for dizziness and headaches.    Physical Exam: Vital signs and nursing notes reviewed.  Patient Vitals for the past 24 hrs:  BP Temp Temp src Pulse  03/12/24 0431 126/75 98.8  F (37.1 C) Oral 90     General: AAO x 3, NAD Heart: RRR Lungs:CTAB Abdomen: Gravid, NT Extremities: No edema SVE: Dilation: 10 Dilation Complete Date: 03/12/24 Dilation Complete Time: 0355 Effacement (%): 80 Station: Plus 1 Presentation: Vertex Exam by:: M Cheeseman CNM   FHR: 144BPM, moderate variability, IA only upon admission per pt movement and inability to cope.  TOCO: Contractions   Labs:   No results for input(s): WBC, HGB, HCT, PLT in the last 72 hours.  Assessment/Plan: 33 y.o. G2P1001 at [redacted]w[redacted]d admit to L&D for advanced cervical dilation and delivery.  -  Reviewed r/o of waterbirth d/t inability to move and cope with labor at this time. - Anticipate NSVD shortly after arrical to L&D.  Fetal wellbeing - FHT category 1 EFW 8lbs 6oz, based off of 36wk growth of 82%tile.   Labor: Active  GBS negative Rubella immune. Rh positive.   Pain control: desires unmedicated. Analgesia/anesthesia PRN  Anticipated MOD: NSVD  Plans to breastfeed, baby girl. POC discussed with patient and support team, all questions answered.  Dr Barbette notified of admission/plan of care.  [name] Both CNM, MSN 03/12/2024, 5:15 AM

## 2024-03-12 NOTE — Lactation Note (Signed)
 This note was copied from a baby's chart. Lactation Consultation Note  Patient Name: Heather Brewer Unijb'd Date: 03/12/2024 Age:31 hours, P2  Reason for consult: Follow-up assessment;Early term 37-38.6wks;Breastfeeding assistance;Mother's request Baby asleep when LC entered the room and per mom the baby is due to feed. LC offered to  change the diaper and assist to latch and mom receptive. LC assisted with positioning and baby to sleepy to latch and baby didn't suck on the gloved finger of the LC. LC placed the baby on mom chest STS and recommended to try in another hour to latch or spoon feed. During the diaper change the baby was gaggy and had small amount of clear secretions. Bulb x 2  LC reviewed the doc flow sheets and baby has had 3 wets and 1 stool, Breast fed x 5 10 -20 mins and attempts, latch scores 8-6-5.  Per mom baby has been sleepy since 2: 30 pm.    Maternal Data Has patient been taught Hand Expression?: Yes (small drops)  Feeding Mother's Current Feeding Choice: Breast Milk  LATCH Score Latch: Too sleepy or reluctant, no latch achieved, no sucking elicited.  Audible Swallowing: None  Type of Nipple: Everted at rest and after stimulation  Comfort (Breast/Nipple): Soft / non-tender  Hold (Positioning): Assistance needed to correctly position infant at breast and maintain latch.  LATCH Score: 5   Lactation Tools Discussed/Used Pump Education: Milk Storage  Interventions Interventions: Breast feeding basics reviewed;Assisted with latch;Skin to skin;Support pillows;Adjust position;Hand express;Reverse pressure;Education;LC Services brochure;CDC Guidelines for Breast Pump Cleaning;CDC milk storage guidelines  Discharge Pump: Personal;DEBP WIC Program: No  Consult Status Consult Status: Follow-up Date: 03/12/24 Follow-up type: In-patient    Rollene Caldron Kayly Kriegel 03/12/2024, 6:03 PM

## 2024-03-12 NOTE — MAU Note (Addendum)
 Heather Brewer is a 33 y.o. at [redacted]w[redacted]d here in MAU reporting: LOF at 0230 - clear fluid. Ctx that have gotten stronger and closer together ever since.   0320 FHR 172   0321 SVE 7 / 80 / -1  Pt unable to lay in bed or stay still in order to obtain VS or place continuous EFM due to pain.

## 2024-03-12 NOTE — Lactation Note (Signed)
 This note was copied from a baby's chart. Lactation Consultation Note  Patient Name: Heather Brewer Unijb'd Date: 03/12/2024 Age:31 hours Reason for consult: Initial assessment;Early term 37-38.6wks;Breastfeeding assistance  P2- MOB reports that her first child struggled with latching, so she exclusively pumped for 14 months instead. LC praised MOB for being able to provide breast milk to her first child for that long. MOB reports being in a lot of pain due to a traumatic birth. LC offered ice packs and heat packs, but MOB declined. MOB informed LC that infant had a shoulder dystocia and she is concerned at infant's arms are hurt because they are blue in color. LC asked if the Pediatrician had seen them and reported concerns over it. MOB denied this. LC reassured MOB that the pediatricians with see her. LC also reassured MOB that infant is not acting like she is in pain when we are moving her. As LC was looking on the computer to look at infant's feedings, MOB looked up shoulder dystocia on her phone. LC encouraged MOB multiple times to not do that, but MOB did it anyway. LC informed RN and pediatrician of MOB's concerns and LC's concerns about MOB's pp anxiety.  Infant was a fast delivery and still has fluid in her belly at this time. Infant was grunting during the entire consult, but no retractions were noted. MOB reported that the RN informed MOB that infant's breathing has been fine despite the grunting. LC informed RN that infant is still grunting. Infant was due for a feeding, so LC first assessed infant's mouth. Palate was intact and sucking reflex was appropriate. Infant was placed on MOB's right breast in the cradle hold per MOB's request. Despite many attempts, infant was not interested. MOB starting saying I'm failing, I'm failing. LC reassured MOB that she is doing a great job and this is normal 24 hr birthday nap behavior for infant. LC encouraged hand expressing at this time. LC hand  expressed the left breast only because MOB's breasts are very sensitive. LC was able to collect 1 mL before MOB wanted to stop. LC finger fed this to infant with a clean gloved finger. Infant became willing to nurse, so she was placed back on the right breast. Infant latched for 5 minutes and had a rhythmic suck. MOB denied having any pain. Once infant fell back asleep, LC placed infant STS on MOB's chest.  LC reviewed the first 24 hr birthday nap, day 2 cluster feeding, feeding infant on cue 8-12x in 24 hrs, not allowing infant to go over 3 hrs without a  feeding, CDC milk storage guidelines, LC services handout and engorgement/breast care. LC encouraged MOB to call for further assistance as needed.  Maternal Data Has patient been taught Hand Expression?: Yes Does the patient have breastfeeding experience prior to this delivery?: Yes How long did the patient breastfeed?: 14 months of exclusive pumping  Feeding Mother's Current Feeding Choice: Breast Milk  LATCH Score Latch: Repeated attempts needed to sustain latch, nipple held in mouth throughout feeding, stimulation needed to elicit sucking reflex.  Audible Swallowing: A few with stimulation  Type of Nipple: Everted at rest and after stimulation  Comfort (Breast/Nipple): Soft / non-tender  Hold (Positioning): Full assist, staff holds infant at breast  LATCH Score: 6   Lactation Tools Discussed/Used Pump Education: Milk Storage  Interventions Interventions: Breast feeding basics reviewed;Assisted with latch;Hand express;Breast compression;Adjust position;Support pillows;Position options;Expressed milk;Education;LC Services brochure  Discharge Discharge Education: Engorgement and breast care;Warning signs for  feeding baby Pump: DEBP;Personal (spectra )  Consult Status Consult Status: Follow-up Date: 03/13/24 Follow-up type: In-patient    Recardo Hoit BS, IBCLC 03/12/2024, 10:20 AM

## 2024-03-13 ENCOUNTER — Encounter (HOSPITAL_COMMUNITY): Payer: Self-pay

## 2024-03-13 ENCOUNTER — Other Ambulatory Visit: Payer: Self-pay

## 2024-03-13 LAB — CBC
HCT: 33.9 % — ABNORMAL LOW (ref 36.0–46.0)
Hemoglobin: 11.4 g/dL — ABNORMAL LOW (ref 12.0–15.0)
MCH: 29.7 pg (ref 26.0–34.0)
MCHC: 33.6 g/dL (ref 30.0–36.0)
MCV: 88.3 fL (ref 80.0–100.0)
Platelets: 202 K/uL (ref 150–400)
RBC: 3.84 MIL/uL — ABNORMAL LOW (ref 3.87–5.11)
RDW: 13.7 % (ref 11.5–15.5)
WBC: 10.7 K/uL — ABNORMAL HIGH (ref 4.0–10.5)
nRBC: 0 % (ref 0.0–0.2)

## 2024-03-13 LAB — BIRTH TISSUE RECOVERY COLLECTION (PLACENTA DONATION)

## 2024-03-13 NOTE — Lactation Note (Signed)
 This note was copied from a baby's chart. Lactation Consultation Note  Patient Name: Heather Brewer Date: 03/13/2024 Age:31 hours Reason for consult: Follow-up assessment;Early term 37-38.6wks  P2- MOB reports that she has been working hard with making sure infant opens widely before latching. MOB reports that infant's feedings have been much better once she turned 24 hrs old. MOB denies having any questions or concerns. MOB has a follow up appt today with Harlene Sick to work on breastfeeding. Infant was rooting so MOB placed infant on the right breast in the cradle hold. Infant latched immediatly with flanged lips. LC reviewed cluster feeding and engorgement/breast care. LC encouraged MOB to call for further assistance as needed.  Maternal Data Has patient been taught Hand Expression?: Yes Does the patient have breastfeeding experience prior to this delivery?: Yes  Feeding Mother's Current Feeding Choice: Breast Milk  LATCH Score Latch: Grasps breast easily, tongue down, lips flanged, rhythmical sucking.  Audible Swallowing: Spontaneous and intermittent  Type of Nipple: Everted at rest and after stimulation  Comfort (Breast/Nipple): Soft / non-tender  Hold (Positioning): No assistance needed to correctly position infant at breast.  LATCH Score: 10   Lactation Tools Discussed/Used Tools: Pump;Flanges Flange Size: 18 Breast pump type: Manual Pump Education: Setup, frequency, and cleaning;Milk Storage Reason for Pumping: MOB request Pumping frequency: 15-20 min every 3 hrs  Interventions Interventions: Breast feeding basics reviewed;Hand pump;Education;LC Services brochure  Discharge Discharge Education: Engorgement and breast care;Warning signs for feeding baby Pump: DEBP;Manual;Personal  Consult Status Consult Status: Complete Date: 03/13/24    Heather Brewer BS, IBCLC 03/13/2024, 10:24 AM

## 2024-03-13 NOTE — Discharge Instructions (Signed)

## 2024-03-13 NOTE — Plan of Care (Signed)
  Problem: Health Behavior/Discharge Planning: Goal: Ability to manage health-related needs will improve Outcome: Adequate for Discharge   Problem: Clinical Measurements: Goal: Ability to maintain clinical measurements within normal limits will improve Outcome: Adequate for Discharge Goal: Will remain free from infection Outcome: Adequate for Discharge Goal: Diagnostic test results will improve Outcome: Adequate for Discharge   Problem: Activity: Goal: Risk for activity intolerance will decrease Outcome: Adequate for Discharge   Problem: Coping: Goal: Level of anxiety will decrease Outcome: Adequate for Discharge   Problem: Elimination: Goal: Will not experience complications related to bowel motility Outcome: Adequate for Discharge Goal: Will not experience complications related to urinary retention Outcome: Adequate for Discharge   Problem: Pain Managment: Goal: General experience of comfort will improve and/or be controlled Outcome: Adequate for Discharge   Problem: Safety: Goal: Ability to remain free from injury will improve Outcome: Adequate for Discharge   Problem: Skin Integrity: Goal: Risk for impaired skin integrity will decrease Outcome: Adequate for Discharge   Problem: Education: Goal: Knowledge of Childbirth will improve Outcome: Adequate for Discharge Goal: Ability to make informed decisions regarding treatment and plan of care will improve Outcome: Adequate for Discharge Goal: Ability to state and carry out methods to decrease the pain will improve Outcome: Adequate for Discharge Goal: Individualized Educational Video(s) Outcome: Adequate for Discharge   Problem: Coping: Goal: Ability to verbalize concerns and feelings about labor and delivery will improve Outcome: Adequate for Discharge   Problem: Life Cycle: Goal: Ability to make normal progression through stages of labor will improve Outcome: Adequate for Discharge Goal: Ability to effectively  push during vaginal delivery will improve Outcome: Adequate for Discharge   Problem: Role Relationship: Goal: Will demonstrate positive interactions with the child Outcome: Adequate for Discharge   Problem: Safety: Goal: Risk of complications during labor and delivery will decrease Outcome: Adequate for Discharge   Problem: Pain Management: Goal: Relief or control of pain from uterine contractions will improve Outcome: Adequate for Discharge   Problem: Education: Goal: Knowledge of condition will improve Outcome: Adequate for Discharge Goal: Individualized Educational Video(s) Outcome: Adequate for Discharge Goal: Individualized Newborn Educational Video(s) Outcome: Adequate for Discharge   Problem: Activity: Goal: Will verbalize the importance of balancing activity with adequate rest periods Outcome: Adequate for Discharge Goal: Ability to tolerate increased activity will improve Outcome: Adequate for Discharge   Problem: Coping: Goal: Ability to identify and utilize available resources and services will improve Outcome: Adequate for Discharge   Problem: Life Cycle: Goal: Chance of risk for complications during the postpartum period will decrease Outcome: Adequate for Discharge   Problem: Role Relationship: Goal: Ability to demonstrate positive interaction with newborn will improve Outcome: Adequate for Discharge   Problem: Skin Integrity: Goal: Demonstration of wound healing without infection will improve Outcome: Adequate for Discharge

## 2024-03-13 NOTE — Discharge Summary (Cosign Needed)
 OB Discharge Summary  Patient Name: Heather Brewer DOB: [birth year] MRN: [id number]  Date of admission: 03/12/2024 Delivering provider: ONEAL SHERRILEE MATSU  Admitting diagnosis: Normal labor [O80, Z37.9] Intrauterine pregnancy: [redacted]w[redacted]d     Secondary diagnosis: Patient Active Problem List   Diagnosis Date Noted   Second degree perineal laceration 03/13/2024   Normal labor 03/12/2024   Postpartum care following vaginal delivery 9/4 03/12/2024   Generalized anxiety disorder 10/30/2022    Date of discharge: 03/13/2024   Discharge diagnosis: Principal Problem:   Postpartum care following vaginal delivery 9/4 Active Problems:   Generalized anxiety disorder   Normal labor   Second degree perineal laceration                                                            Augmentation: N/A Pain control: Local Laceration:2nd degree Complications: Precipitous labor  Hospital course:  Onset of Labor With Vaginal Delivery      33 y.o. yo G2P1001 at [redacted]w[redacted]d was admitted in  on 03/12/2024. Labor course was complicated by precipitous delivery. Membrane Rupture Time/Date: 2:30 AM,03/12/2024  Delivery Method:Vaginal, Spontaneous Operative Delivery:N/A Episiotomy: None Lacerations:  2nd degree Patient had an uncomplicated postpartum course.  She is ambulating, tolerating a regular diet, passing flatus, and urinating well. Patient is discharged home in stable condition on 03/13/24.  Newborn Data: Birth date:03/12/2024 Birth time:4:09 AM Gender:Female Living status:Living Apgars:6 ,9  Weight:3997 g  Physical exam  Vitals:   03/12/24 1200 03/12/24 1536 03/12/24 2031 03/13/24 0519  BP: 113/84 123/79 127/84 115/78  Pulse: 93 (!) 109 86 94  Resp: 18 18 16 16   Temp: 98.5 F (36.9 C) 98.5 F (36.9 C) (!) 97.5 F (36.4 C) 98 F (36.7 C)  TempSrc: Oral Oral Axillary Oral  SpO2:  98% 98% 98%   General: alert and cooperative Lochia: appropriate Uterine Fundus: firm Perineum: repair intact, no  edema DVT Evaluation: No evidence of DVT seen on physical exam.  Labs: Lab Results  Component Value Date   WBC 10.7 (H) 03/13/2024   HGB 11.4 (L) 03/13/2024   HCT 33.9 (L) 03/13/2024   MCV 88.3 03/13/2024   PLT 202 03/13/2024      03/12/2024    7:55 AM  Edinburgh Postnatal Depression Scale Screening Tool  I have been able to laugh and see the funny side of things. --   Discharge instructions:  per After Visit Summary  After Visit Meds:  Allergies as of 03/13/2024       Reactions   Mangifera Indica Hives   African Mango [irvingia [place]onensis]         Medication List     STOP taking these medications    Fish Oil 1000 MG Caps   progesterone 200 MG Supp       TAKE these medications    Prenatal Vitamin and Mineral 28-0.8 MG Tabs Take by mouth.       Activity: Advance as tolerated. Pelvic rest for 6 weeks.   Newborn Data: Live born female  Birth Weight: 8 lb 13 oz (3997 g) APGAR: 6, 9  Newborn Delivery   Time head delivered: 03/12/2024 04:08:00 Birth date/time: 03/12/2024 95:90:75 Delivery type: Vaginal, Spontaneous    Named Heather Brewer Baby Feeding: Breast Disposition:home with mother  Delivery Report:  Review the Delivery Report for  details.    Follow up:  Follow-up Information     Joshua Alan POUR, CNM. Schedule an appointment as soon as possible for a visit in 6 week(s).   Specialty: Certified Nurse Midwife Contact information: [address] [place] [address] [phone]                Alan POUR Joshua, CNM, MSN 03/13/2024, 9:47 AM

## 2024-03-13 NOTE — Social Work (Signed)
 MOB was referred for history of depression/anxiety.  * Referral screened out by Clinical Social Worker because none of the following criteria appear to apply:  ~ History of anxiety/depression during this pregnancy, or of post-partum depression following prior delivery.  ~ Diagnosis of anxiety and/or depression within last 3 years OR * MOB's symptoms currently being treated with medication and/or therapy.  Per chart review MOB diagnosis was prior to September 2022, no MH concerns noted during pregnancy. Edinburgh=8  Please contact the Clinical Social Worker if needs arise, by MOB request.  Heather Brewer, LCSWA Clinical Social Worker [phone]

## 2024-03-14 NOTE — Lactation Note (Signed)
 This note was copied from a baby's chart. Lactation Consultation Note  Patient Name: Heather Brewer'd Date: 03/14/2024 Age:31 hours Reason for consult: Mother's request  C/O readmittance due to tachypnea, infant having low blood glucose of 35 and 39 mg/ dl,  infant with a change in weight loss  -6.56 to -9.19% in past 24 hours.  Per MOB, infant has been breastfeeding well,  infant has been cluster feeding today. MOB was doing skin to skin as LC entered the room. Prior to latching infant at the breast, infant became fussy, infant was given 4 mls of EBM and appeared calmer. Infant had large stool ( blackish)  that FOB change. MOB re-latched infant on her left breast with pillow support using the cross cradle hold and doing compressions, infant was actively breastfeeding, and breast feed for 20 minutes. MOB was set up with DEBP and expressed 1 ml of colostrum. LC discussed if infant's blood glucose levels continue to be low, MOB would need to supplement infant with her EBM first and then formula. Parents would like to use their own personal formula to supplement infant with if infant's blood glucose levels continue to remain low.   Current Feeding Plan: 1-MOB will continue to breastfeed infant , 20 minutes at breast to reserve energy, afterwards supplement infant with any EBM first and then formula. 2- MOB was given handout  Supplementing with Breastfeeding.  3-MOB knows after latching infant at the breast to offer (7-12 mls) EBM/Formula per feeding or more if infant wants it, MOB will continue to supplement infant after each feeding until infant's blood sugar level is within limits and weight loss is stablized.   Maternal Data    Feeding Mother's Current Feeding Choice: Breast Milk  LATCH Score Latch: Grasps breast easily, tongue down, lips flanged, rhythmical sucking.  Audible Swallowing: A few with stimulation  Type of Nipple: Everted at rest and after stimulation  Comfort  (Breast/Nipple): Soft / non-tender  Hold (Positioning): Assistance needed to correctly position infant at breast and maintain latch.  LATCH Score: 8   Lactation Tools Discussed/Used Tools: Pump Flange Size: 18 Breast pump type: Double-Electric Breast Pump Pumping frequency: every 3 hours  Interventions Interventions: Skin to skin;Assisted with latch;Breast compression;Adjust position;Support pillows;Position options;Expressed milk;DEBP  Discharge Pump: DEBP;Personal  Consult Status Consult Status: Follow-up Date: 03/14/24 Follow-up type: In-patient    Heather Brewer 03/14/2024, 2:08 AM

## 2024-03-14 NOTE — Lactation Note (Signed)
 This note was copied from a baby's chart. Lactation Consultation Note  Patient Name: Heather Brewer Unijb'd Date: 03/14/2024 Age:31 hours   P2- LC notified by charge RN on the Peds Unit that the Dr would like for the [facility] team to consult with MOB again. This LC had spent a lot of time working with this family while admitted to the hospital after delivery. Infant was latching well when discharged, but was readmitted and has had consistent low glucose readings for her age (34,39,48,46,47,38,61,43 and 73).   MOB has been informed to latch infant to the breast for no longer than 10 minutes. Hand infant to FOB for a fortified bottle feeding of formula. And for MOB to pump for 15 minutes to express EBM. The Charge RN reported working with this family all day today and encouraging/reinforcing the feeding plan. LC team will follow up with this family.  Recardo Hoit BS, IBCLC 03/14/2024, 5:56 PM

## 2024-03-14 NOTE — Lactation Note (Signed)
 This note was copied from a baby's chart. Lactation Consultation Note  Patient Name: Heather Brewer Unijb'd Date: 03/14/2024 Age:31 hours Reason for consult: Initial assessment;Early term 37-38.6wks  LC had to wait on Rn to finish assessing baby. Mom very appreciative of LC coming. It is time for baby to feed. At first baby wasn't all that interested in feeding. Baby had hiccups. Slightly jaundice in appearance. Baby alert looking around. Mom placed baby in football position, and baby finally latched after a few tries. Encouraged mom to take deep breaths and relax. Mom has good support pillows behind her back and under baby to bring baby tot he breast. Suggested mom feel breast before and after BF for transfer. Mom's breast are filling. LC couldn't notice any softening after feeding. Baby suckling well, looks like had some swallows. Couldn't audibly hear any. No noticeable heart shaped tongue. Baby does have labial frenulum, not severe or to thick. High palate noted. Mom stated if baby doesn't have a deep latch then it will pinch. Encouraged to flange lips to have wide deep latch. Feeding looked good. Mom denied painful latch at this time. Baby BF for 10 min. Then dad gave formula w/30 ml while mom used DEBP w/her hands free bra. Suggested to give what she pumps at the next feeding 1st then give 30 ml formula. Baby had a good pre-glucose level 80. Suggested mom continue to BF for 10 min each feeding tonight and if glucose level stays above 60 then she can increase feedings to 15 min. At the breast and cont. To supplement w/mom's BM then fortified formula 30 ml + tomorrow. Mom has Lactation Consultant coming on Monday as well as Ped. MD appt. Monday. Mom hopes to be d/c from hospital tomorrow (sun). Mom is to pump q 3 hr.  Praised mom for her hard work. Encouraged to relax and hydrate. Mom had uterine cramping during BF.     Maternal Data Has patient been taught Hand Expression?:  Yes How long did the patient breastfeed?: 14 months exclusively pumped and bottle  Feeding Nipple Type: Dr. Jonna Galli  LATCH Score Latch: Grasps breast easily, tongue down, lips flanged, rhythmical sucking.  Audible Swallowing: A few with stimulation  Type of Nipple: Everted at rest and after stimulation  Comfort (Breast/Nipple): Filling, red/small blisters or bruises, mild/mod discomfort (breast filling)  Hold (Positioning): No assistance needed to correctly position infant at breast.  LATCH Score: 8   Lactation Tools Discussed/Used Tools: Pump;Flanges Flange Size: 18 Breast pump type: Double-Electric Breast Pump Reason for Pumping: supplementation Pumping frequency: q 3hr  Interventions Interventions: Breast feeding basics reviewed;Skin to skin;Assisted with latch;Breast massage;Hand express;Breast compression;Adjust position;Support pillows;Position options;DEBP;Education  Discharge Discharge Education: Outpatient recommendation Pump: DEBP  Consult Status Consult Status: PRN Date: 03/15/24 Follow-up type: In-patient    Kamdin Follett G 03/14/2024, 9:12 PM

## 2024-03-24 DIAGNOSIS — R102 Pelvic and perineal pain: Secondary | ICD-10-CM | POA: Diagnosis not present

## 2024-03-25 ENCOUNTER — Telehealth (HOSPITAL_COMMUNITY): Payer: Self-pay | Admitting: *Deleted

## 2024-03-25 NOTE — Telephone Encounter (Signed)
 03/25/2024  Name: Heather Brewer MRN: [id number] DOB: [birth year]  Reason for Call:  Transition of Care Hospital Discharge Call  Contact Status: Patient Contact Status: Complete  Language assistant needed: Interpreter Mode: Interpreter Not Needed        Follow-Up Questions: Do You Have Any Concerns About Your Health As You Heal From Delivery?: No Do You Have Any Concerns About Your Infants Health?: No  Edinburgh Postnatal Depression Scale:  In the Past 7 Days:   Patient declines screening today due to events of the last two weeks (baby was readmitted to PICU, pt had to see CNM yesterday for concern about her healing), so she is sure her score would be elevated. She reports feeling much better in the last two days. She said that she and her midwife discussed her current emotional health and patient plans to pursue therapy.  Patient is aware of resources available to her.  PHQ2-9 Depression Scale:     Discharge Follow-up: Patient was advised of the following resources:: Support Group, Breastfeeding Support Group  Post-discharge interventions: Reviewed Newborn Safe Sleep Practices Maternal Mental Health Resources provided  Mliss Sieve, RN 03/25/2024 17:24

## 2024-03-30 DIAGNOSIS — Z711 Person with feared health complaint in whom no diagnosis is made: Secondary | ICD-10-CM | POA: Diagnosis not present

## 2024-03-30 DIAGNOSIS — N8110 Cystocele, unspecified: Secondary | ICD-10-CM | POA: Diagnosis not present

## 2024-04-22 DIAGNOSIS — L700 Acne vulgaris: Secondary | ICD-10-CM | POA: Diagnosis not present

## 2024-04-22 DIAGNOSIS — L821 Other seborrheic keratosis: Secondary | ICD-10-CM | POA: Diagnosis not present

## 2024-04-22 DIAGNOSIS — L814 Other melanin hyperpigmentation: Secondary | ICD-10-CM | POA: Diagnosis not present

## 2024-04-22 DIAGNOSIS — L400 Psoriasis vulgaris: Secondary | ICD-10-CM | POA: Diagnosis not present

## 2024-04-22 DIAGNOSIS — D1801 Hemangioma of skin and subcutaneous tissue: Secondary | ICD-10-CM | POA: Diagnosis not present

## 2024-04-23 ENCOUNTER — Ambulatory Visit: Payer: Self-pay

## 2024-04-23 ENCOUNTER — Encounter: Payer: Self-pay | Admitting: Physician Assistant

## 2024-04-23 ENCOUNTER — Ambulatory Visit (INDEPENDENT_AMBULATORY_CARE_PROVIDER_SITE_OTHER): Admitting: Physician Assistant

## 2024-04-23 VITALS — BP 100/64 | HR 67 | Temp 98.1°F | Ht 64.0 in | Wt 161.2 lb

## 2024-04-23 DIAGNOSIS — R002 Palpitations: Secondary | ICD-10-CM | POA: Diagnosis not present

## 2024-04-23 DIAGNOSIS — F418 Other specified anxiety disorders: Secondary | ICD-10-CM | POA: Diagnosis not present

## 2024-04-23 DIAGNOSIS — L700 Acne vulgaris: Secondary | ICD-10-CM | POA: Insufficient documentation

## 2024-04-23 DIAGNOSIS — O99345 Other mental disorders complicating the puerperium: Secondary | ICD-10-CM

## 2024-04-23 LAB — CBC WITH DIFFERENTIAL/PLATELET
Basophils Absolute: 0 K/uL (ref 0.0–0.1)
Basophils Relative: 0.5 % (ref 0.0–3.0)
Eosinophils Absolute: 0.1 K/uL (ref 0.0–0.7)
Eosinophils Relative: 2.1 % (ref 0.0–5.0)
HCT: 39.8 % (ref 36.0–46.0)
Hemoglobin: 13.3 g/dL (ref 12.0–15.0)
Lymphocytes Relative: 21.4 % (ref 12.0–46.0)
Lymphs Abs: 1.4 K/uL (ref 0.7–4.0)
MCHC: 33.5 g/dL (ref 30.0–36.0)
MCV: 86.6 fl (ref 78.0–100.0)
Monocytes Absolute: 0.4 K/uL (ref 0.1–1.0)
Monocytes Relative: 6.3 % (ref 3.0–12.0)
Neutro Abs: 4.5 K/uL (ref 1.4–7.7)
Neutrophils Relative %: 69.7 % (ref 43.0–77.0)
Platelets: 309 K/uL (ref 150.0–400.0)
RBC: 4.59 Mil/uL (ref 3.87–5.11)
RDW: 13.5 % (ref 11.5–15.5)
WBC: 6.5 K/uL (ref 4.0–10.5)

## 2024-04-23 LAB — MAGNESIUM: Magnesium: 2 mg/dL (ref 1.5–2.5)

## 2024-04-23 LAB — COMPREHENSIVE METABOLIC PANEL WITH GFR
ALT: 34 U/L (ref 0–35)
AST: 27 U/L (ref 0–37)
Albumin: 4.4 g/dL (ref 3.5–5.2)
Alkaline Phosphatase: 76 U/L (ref 39–117)
BUN: 23 mg/dL (ref 6–23)
CO2: 28 meq/L (ref 19–32)
Calcium: 9.7 mg/dL (ref 8.4–10.5)
Chloride: 103 meq/L (ref 96–112)
Creatinine, Ser: 0.95 mg/dL (ref 0.40–1.20)
GFR: 79.86 mL/min (ref >60.00)
Glucose, Bld: 109 mg/dL — ABNORMAL HIGH (ref 70–99)
Potassium: 4.3 meq/L (ref 3.5–5.1)
Sodium: 140 meq/L (ref 135–145)
Total Bilirubin: 0.4 mg/dL (ref 0.2–1.2)
Total Protein: 7.2 g/dL (ref 6.0–8.3)

## 2024-04-23 LAB — IBC + FERRITIN
Ferritin: 15.8 ng/mL (ref 10.0–291.0)
Iron: 86 ug/dL (ref 42–145)
Saturation Ratios: 19.5 % — ABNORMAL LOW (ref 20.0–50.0)
TIBC: 441 ug/dL (ref 250.0–450.0)
Transferrin: 315 mg/dL (ref 212.0–360.0)

## 2024-04-23 LAB — TSH: TSH: 0.71 u[IU]/mL (ref 0.35–5.50)

## 2024-04-23 NOTE — Telephone Encounter (Signed)
  FYI Only or Action Required?: FYI only for provider.  Patient was last seen in primary care on 03/26/2023 by Job Lukes, PA.  Called Nurse Triage reporting Palpitations.  Symptoms began several days ago.  Interventions attempted: Rest, hydration, or home remedies.  Symptoms are: gradually worsening.  Triage Disposition: See HCP Within 4 Hours (Or PCP Triage)  Patient/caregiver understands and will follow disposition?: Yes            Copied from CRM [id number]. Topic: Clinical - Red Word Triage >> Apr 23, 2024  8:17 AM Pinkey ORN wrote: Red Word that prompted transfer to Nurse Triage: Increased Heart Palpitations >> Apr 23, 2024  8:18 AM Pinkey ORN wrote: Patient states she's 6 weeks postpartum and is experiencing some heart palpitations that seems to be increasing. Patient describes it as her heart is doing flips and is sinking into her stomach.  Reason for Disposition  [1] Skipped or extra beat(s) AND [2] occurs 4 or more times per minute  Answer Assessment - Initial Assessment Questions 1. DESCRIPTION: Please describe your heart rate or heartbeat that you are having (e.g., fast/slow, regular/irregular, skipped or extra beats, palpitations)     Palpitations - heart felt like it was in my throat my heart was fluttering or like a flip 2. ONSET: When did it start? (e.g., minutes, hours, days)      2 days ago - at night while pumping.  3. DURATION: How long does it last (e.g., seconds, minutes, hours)     Events lasts seconds, but can happen 5x in a hour. 4. PATTERN Does it come and go, or has it been constant since it started?  Does it get worse with exertion?   Are you feeling it now?     Comes and goes, but becoming more frequent 5. TAP: Using your hand, can you tap out what you are feeling on a chair or table in front of you, so that I can hear? Note: Not all patients can do this.       N/a 6. HEART RATE: Can you tell me your heart rate?  How many beats in 15 seconds?  Note: Not all patients can do this.       N/a 7. RECURRENT SYMPTOM: Have you ever had this before? If Yes, ask: When was the last time? and What happened that time?      denies 8. CAUSE: What do you think is causing the palpitations?     Unknown, initially thought r/t anxiety d/t having a newborn and pumping, but 9. CARDIAC HISTORY: Do you have any history of heart disease? (e.g., heart attack, angina, bypass surgery, angioplasty, arrhythmia)      denies 10. OTHER SYMPTOMS: Do you have any other symptoms? (e.g., dizziness, chest pain, sweating, difficulty breathing)       denies 11. PREGNANCY: Is there any chance you are pregnant? When was your last menstrual period?       6 weeks post-partum  Protocols used: Heart Rate and Heartbeat Questions-A-AH

## 2024-04-23 NOTE — Telephone Encounter (Signed)
 Appt today

## 2024-04-23 NOTE — Progress Notes (Signed)
 Heather Brewer is a 33 y.o. female here for a new problem.  History of Present Illness:   Chief Complaint  Patient presents with   Palpitations    Pt c/o palpitations off and on through out the day,   Discussed the use of AI scribe software for clinical note transcription with the patient, who gave verbal consent to proceed.  History of Present Illness   Heather Brewer is a 33 year old female with postpartum anxiety who presents with heart palpitations.  She is six weeks postpartum and experiencing significant anxiety, which is exacerbated by managing a toddler and a newborn without daycare support. She had a traumatic birth experience and a high-risk pregnancy due to a short cervix, leading to concerns about preterm labor. Her newborn had rapid breathing and low blood sugar shortly after birth, requiring a stay in the pediatric ICU, contributing to her anxiety.  She experiences heart palpitations, describing sensations of her heart being in her throat, heart fluttering, and heart flipping. These began a few days ago, initially during pumping sessions but now occur at various times, including during non-anxiety-inducing activities. She experiences lightheadedness, which she attributes to inadequate nutrition and hydration. She is exclusively pumping breast milk, with a good supply, and pumps seven times a day.  No current vaginal bleeding. She is not using hormonal contraception due to past adverse effects. Her husband is considering a vasectomy. She does not consume caffeine and has recently increased her intake of pretzels. No recent illness except for a minor sore throat and stuffy nose at the time of her daughter's birth.  Her family history includes a grandmother with a blood clot after prolonged immobility and a half-brother with a heart defect. She is currently taking a prenatal vitamin and lecithin for clogged ducts. She has psoriasis, which worsens during pregnancy and  postpartum, and she has been picking at it due to anxiety.       Past Medical History:  Diagnosis Date   Anxiety    Anxiety about health 10/30/2022   Chest tightness    Gestational diabetes 2022   checked blood sugars regularly, no medications   Headache    Herpes    Nail abnormality    pale, ridges at time, nail-biter   Pelvic floor dysfunction 06/30/2021   Formatting of this note might be different from the original.  06/30/2021: Had triage visit for concern for PTL earlier in the month. Was ruled out. Eventually saw PFPT and diagnosed with pelvic floor dysfunction. Plan is to continue following with PFPT. Barbarann Basket, CNM     Vaginal delivery 08/2021     Social History   Tobacco Use   Smoking status: Never   Smokeless tobacco: Never  Vaping Use   Vaping status: Never Used  Substance Use Topics   Alcohol use: Not Currently    Comment: occasionally   Drug use: Never    Past Surgical History:  Procedure Laterality Date   NO PAST SURGERIES      Family History  Problem Relation Age of Onset   Healthy Mother    Healthy Father    Cancer Paternal Grandmother        SKIN   Breast cancer Neg Hx    Colon cancer Neg Hx    Cancer Paternal Grandfather     Allergies  Allergen Reactions   Mangifera Indica Hives   African Mango [Irvingia Gabonensis]    Mango Flavoring Agent (Non-Screening) Hives    Current Medications:  Current Outpatient Medications:    OVER THE COUNTER MEDICATION, Take 2 capsules by mouth daily in the afternoon. Sunflower Lecithin 1000-200 mg, Disp: , Rfl:    Prenatal Vit-Fe Fumarate-FA (PRENATAL VITAMIN AND MINERAL) 28-0.8 MG TABS, Take by mouth., Disp: , Rfl:    Review of Systems:   Negative unless otherwise specified per HPI.  Vitals:   Vitals:   04/23/24 1155  BP: 100/64  Pulse: 67  Temp: 98.1 F (36.7 C)  TempSrc: Temporal  SpO2: 99%  Weight: 161 lb 4 oz (73.1 kg)  Height: 5' 4 (1.626 m)     Body mass index is 27.68  kg/m.  Physical Exam:   Physical Exam Vitals and nursing note reviewed.  Constitutional:      General: She is not in acute distress.    Appearance: She is well-developed. She is not ill-appearing or toxic-appearing.  Cardiovascular:     Rate and Rhythm: Normal rate and regular rhythm.     Pulses: Normal pulses.     Heart sounds: Normal heart sounds, S1 normal and S2 normal.  Pulmonary:     Effort: Pulmonary effort is normal.     Breath sounds: Normal breath sounds.  Musculoskeletal:     Comments: No swelling to bilateral calves or tenderness to palpation   Skin:    General: Skin is warm and dry.  Neurological:     Mental Status: She is alert.     GCS: GCS eye subscore is 4. GCS verbal subscore is 5. GCS motor subscore is 6.  Psychiatric:        Speech: Speech normal.        Behavior: Behavior normal. Behavior is cooperative.     Assessment and Plan:   Assessment and Plan    Palpitations  Intermittent palpitations during pumping, likely exacerbated by inadequate nutrition and hydration. Differential includes anxiety, postpartum thyroid  dysfunction, and benign arrhythmias. EKG normal. Further evaluation needed to rule out structural heart issues or arrhythmias.  - Order blood work for thyroid  function, hemoglobin, and blood glucose. - Refer to cardiology for potential echocardiogram and heart monitor. We may order these if there is a delay in getting her scheduled. - Advise emergency care if chest pain or dyspnea develops. - Encourage hydration and nutrition. - PERCscore is 0 - EKG tracing is personally reviewed.  EKG notes NSR.  No acute changes.    Postpartum anxiety Anxiety linked to traumatic birth and concerns for newborn, heightened during pumping. Therapy initiated with postpartum mental health specialist. - Continue therapy with postpartum mental health specialist. - Encourage self-care, including nutrition and hydration. - Discussed anxiety's impact on  palpitations and well-being.  I personally spent a total of 54 minutes in the care of the patient today including preparing to see the patient, getting/reviewing separately obtained history, performing a medically appropriate exam/evaluation, counseling and educating, placing orders, documenting clinical information in the EHR, and independently interpreting results.   Lucie Buttner, PA-C

## 2024-04-24 ENCOUNTER — Ambulatory Visit: Payer: Self-pay | Admitting: Physician Assistant

## 2024-05-01 DIAGNOSIS — Z1331 Encounter for screening for depression: Secondary | ICD-10-CM | POA: Diagnosis not present

## 2024-05-11 ENCOUNTER — Ambulatory Visit (INDEPENDENT_AMBULATORY_CARE_PROVIDER_SITE_OTHER): Admitting: Cardiovascular Disease

## 2024-05-11 ENCOUNTER — Other Ambulatory Visit (INDEPENDENT_AMBULATORY_CARE_PROVIDER_SITE_OTHER)

## 2024-05-11 ENCOUNTER — Encounter (HOSPITAL_BASED_OUTPATIENT_CLINIC_OR_DEPARTMENT_OTHER): Payer: Self-pay | Admitting: Cardiovascular Disease

## 2024-05-11 VITALS — BP 112/64 | HR 68 | Ht 64.0 in | Wt 161.0 lb

## 2024-05-11 DIAGNOSIS — R002 Palpitations: Secondary | ICD-10-CM | POA: Insufficient documentation

## 2024-05-11 DIAGNOSIS — H6123 Impacted cerumen, bilateral: Secondary | ICD-10-CM | POA: Diagnosis not present

## 2024-05-11 NOTE — Progress Notes (Signed)
 Cardiology Office Note:  .   Date:  05/11/2024  ID:  Heather Brewer, DOB May 13, 1991, MRN [id number] PCP: Job Lukes, PA  [facility] Providers Cardiologist:  None    History of Present Illness: .    Heather Brewer is a 33 y.o. female with no significant cardiac history here for evaluation of palpitations.  She saw Dr. Hobart in 2022 in the setting of having COVID-19 the month prior.  She noted chest pain and persistent exertional dyspnea.  Most time she was seen in clinic her symptoms have subsided and no further testing was advised.  Discussed the use of AI scribe software for clinical note transcription with the patient, who gave verbal consent to proceed.  History of Present Illness Heather Brewer began experiencing heart palpitations approximately one month postpartum, describing them as a 'fluttering' or feeling like her heart 'flipped' once or twice. Initially, these palpitations occurred while pumping breast milk but soon began happening at various times throughout the day, including during meals. The frequency of palpitations has decreased, now occurring once every couple of days, with each episode lasting only a second. No associated chest pain or shortness of breath.  An EKG performed during an episode of palpitations revealed sinus rhythm.  She has a history of postpartum anxiety, particularly related to her daughter's previous PICU stay, and experiences stress around pumping due to past experiences of insufficient milk production. Her physical activity has been limited due to stage two prolapse and a recent high-risk pregnancy that required modified bed rest. She feels deconditioned and experiences lightheadedness, which she attributes to inadequate nutrition and hydration while managing a newborn and a toddler.  Her sleep is variable, ranging from two to six hours per night, depending on the demands of her children. She is a mother of two young children,  including a newborn and a toddler, and works in psychologist, counselling for [employer]. No family history of heart disease, heart attacks, or strokes.  ROS:  As per HPI  Studies Reviewed: SABRA       EKG 04/23/24:  Sinus rhythm.  Rate 62 bpm.  Risk Assessment/Calculations:             Physical Exam:   VS:  BP 112/64 (BP Location: Left Arm, Patient Position: Sitting, Cuff Size: Normal)   Pulse 68   Ht 5' 4 (1.626 m)   Wt 161 lb (73 kg)   LMP 06/17/2023   SpO2 96%   BMI 27.64 kg/m  , BMI Body mass index is 27.64 kg/m. GENERAL:  Well appearing HEENT: Pupils equal round and reactive, fundi not visualized, oral mucosa unremarkable NECK:  No jugular venous distention, waveform within normal limits, carotid upstroke brisk and symmetric, no bruits, no thyromegaly LUNGS:  Clear to auscultation bilaterally HEART:  RRR.  PMI not displaced or sustained,S1 and S2 within normal limits, no S3, no S4, no clicks, no rubs, no murmurs ABD:  Flat, positive bowel sounds normal in frequency in pitch, no bruits, no rebound, no guarding, no midline pulsatile mass, no hepatomegaly, no splenomegaly EXT:  2 plus pulses throughout, no edema, no cyanosis no clubbing SKIN:  No rashes no nodules NEURO:  Cranial nerves II through XII grossly intact, motor grossly intact throughout PSYCH:  Cognitively intact, oriented to person place and time   ASSESSMENT AND PLAN: .    Assessment & Plan # Palpitations:  Intermittent postpartum palpitations likely due to hormonal changes, stress, sleep deprivation, and dehydration.  Her  description sounds like PACs or PVCs.   Low risk for malignant arrhythmias given age and lack of cardiac history either personally or in her family. Symptoms expected to improve with reduced stressors.  Labs have been unremarkable.  There is no evidence of heart failure on exam or by history. - Order 1-week Zio monitor to confirm diagnosis and rule out dangerous  arrhythmias. - Advise to avoid submerging monitor in water; showering permitted. - Instruct to mail back monitor after use for data analysis. - Follow up based on monitor results.  Recording duration: 20 minutes      Dispo: f/u as needed.   Signed, Annabella Scarce, MD

## 2024-05-11 NOTE — Patient Instructions (Signed)
Medication Instructions:  Your physician recommends that you continue on your current medications as directed. Please refer to the Current Medication list given to you today.   Labwork: NONE  Testing/Procedures: 7 DAY ZIO   Follow-Up: AS NEEDED   Any Other Special Instructions Will Be Listed Below (If Applicable).  ZIO XT- Long Term Monitor Instructions  Your physician has requested you wear a ZIO patch monitor for 14 days.  This is a single patch monitor. [employer] supplies one patch monitor per enrollment. Additional stickers are not available. Please do not apply patch if you will be having a Nuclear Stress Test,  Echocardiogram, Cardiac CT, MRI, or Chest Xray during the period you would be wearing the  monitor. The patch cannot be worn during these tests. You cannot remove and re-apply the  ZIO XT patch monitor.  Your ZIO patch monitor will be mailed 3 day USPS to your address on file. It may take 3-5 days  to receive your monitor after you have been enrolled.  Once you have received your monitor, please review the enclosed instructions. Your monitor  has already been registered assigning a specific monitor serial # to you.  Billing and Patient Assistance Program Information  We have supplied [employer] with any of your insurance information on file for billing purposes. [employer] offers a sliding scale Patient Assistance Program for patients that do not have  insurance, or whose insurance does not completely cover the cost of the ZIO monitor.  You must apply for the Patient Assistance Program to qualify for this discounted rate.  To apply, please call [employer] at [phone], select option 4, select option 2, ask to apply for  Patient Assistance Program. Theodore Demark will ask your household income, and how many people  are in your household. They will quote your out-of-pocket cost based on that information.  [employer] will also be able to set up a 17-month interest-free payment plan if  needed.  Applying the monitor   Shave hair from upper left chest.  Hold abrader disc by orange tab. Rub abrader in 40 strokes over the upper left chest as  indicated in your monitor instructions.  Clean area with 4 enclosed alcohol pads. Let dry.  Apply patch as indicated in monitor instructions. Patch will be placed under collarbone on left  side of chest with arrow pointing upward.  Rub patch adhesive wings for 2 minutes. Remove white label marked "1". Remove the white  label marked "2". Rub patch adhesive wings for 2 additional minutes.  While looking in a mirror, press and release button in center of patch. A small green light will  flash 3-4 times. This will be your only indicator that the monitor has been turned on.  Do not shower for the first 24 hours. You may shower after the first 24 hours.  Press the button if you feel a symptom. You will hear a small click. Record Date, Time and  Symptom in the Patient Logbook.  When you are ready to remove the patch, follow instructions on the last 2 pages of Patient  Logbook. Stick patch monitor onto the last page of Patient Logbook.  Place Patient Logbook in the blue and white box. Use locking tab on box and tape box closed  securely. The blue and white box has prepaid postage on it. Please place it in the mailbox as  soon as possible. Your physician should have your test results approximately 7 days after the  monitor has been [facility]  Call [facility] at [phone] if you have questions regarding  your ZIO XT patch monitor. Call them immediately if you see an orange light blinking on your  monitor.  If your monitor falls off in less than 4 days, contact our Monitor department at [phone].  If your monitor becomes loose or falls off after 4 days call [employer] at [phone] for  suggestions on securing your monitor

## 2024-05-12 DIAGNOSIS — L309 Dermatitis, unspecified: Secondary | ICD-10-CM | POA: Diagnosis not present

## 2024-05-12 DIAGNOSIS — L299 Pruritus, unspecified: Secondary | ICD-10-CM | POA: Diagnosis not present

## 2024-05-12 DIAGNOSIS — H938X1 Other specified disorders of right ear: Secondary | ICD-10-CM | POA: Diagnosis not present

## 2024-05-15 DIAGNOSIS — N814 Uterovaginal prolapse, unspecified: Secondary | ICD-10-CM | POA: Diagnosis not present

## 2024-05-22 DIAGNOSIS — R002 Palpitations: Secondary | ICD-10-CM | POA: Diagnosis not present

## 2024-06-01 ENCOUNTER — Encounter (HOSPITAL_BASED_OUTPATIENT_CLINIC_OR_DEPARTMENT_OTHER): Payer: Self-pay | Admitting: Cardiovascular Disease

## 2024-06-02 ENCOUNTER — Emergency Department (HOSPITAL_BASED_OUTPATIENT_CLINIC_OR_DEPARTMENT_OTHER)
Admission: EM | Admit: 2024-06-02 | Discharge: 2024-06-02 | Discharge status: Against Medical Advice | Attending: Emergency Medicine | Admitting: Emergency Medicine

## 2024-06-02 ENCOUNTER — Other Ambulatory Visit: Payer: Self-pay

## 2024-06-02 DIAGNOSIS — H9209 Otalgia, unspecified ear: Secondary | ICD-10-CM | POA: Insufficient documentation

## 2024-06-02 DIAGNOSIS — Z5321 Procedure and treatment not carried out due to patient leaving prior to being seen by health care provider: Secondary | ICD-10-CM | POA: Insufficient documentation

## 2024-06-02 DIAGNOSIS — H608X3 Other otitis externa, bilateral: Secondary | ICD-10-CM | POA: Diagnosis not present

## 2024-06-02 DIAGNOSIS — M2669 Other specified disorders of temporomandibular joint: Secondary | ICD-10-CM | POA: Diagnosis not present

## 2024-06-02 DIAGNOSIS — H9203 Otalgia, bilateral: Secondary | ICD-10-CM | POA: Diagnosis not present

## 2024-06-02 NOTE — ED Triage Notes (Signed)
 Double ear infection. Cultured 11/3 and came back with a result that was never communicated to her. Resolved, but returned 2 days ago worse. ENT appointment tomorrow but pain in unbearable.

## 2024-06-08 ENCOUNTER — Ambulatory Visit: Payer: Self-pay | Admitting: Cardiovascular Disease

## 2024-06-08 DIAGNOSIS — R002 Palpitations: Secondary | ICD-10-CM | POA: Diagnosis not present

## 2024-06-09 DIAGNOSIS — N907 Vulvar cyst: Secondary | ICD-10-CM | POA: Diagnosis not present

## 2024-06-09 NOTE — Telephone Encounter (Signed)
 Patient is following up. She would like a call back to review results on the phone if at all possible. She says she would just like clarification on some of the clinical verbiage. Please advise.

## 2024-06-11 ENCOUNTER — Encounter: Payer: Self-pay | Admitting: Obstetrics & Gynecology

## 2024-06-11 ENCOUNTER — Other Ambulatory Visit: Payer: Self-pay | Admitting: Obstetrics & Gynecology

## 2024-06-11 DIAGNOSIS — N907 Vulvar cyst: Secondary | ICD-10-CM

## 2024-06-17 ENCOUNTER — Other Ambulatory Visit

## 2024-06-22 ENCOUNTER — Inpatient Hospital Stay
Admission: RE | Admit: 2024-06-22 | Discharge: 2024-06-22 | Attending: Obstetrics & Gynecology | Admitting: Obstetrics & Gynecology

## 2024-06-22 DIAGNOSIS — N907 Vulvar cyst: Secondary | ICD-10-CM

## 2024-06-26 ENCOUNTER — Other Ambulatory Visit: Payer: Self-pay | Admitting: Obstetrics & Gynecology

## 2024-06-26 DIAGNOSIS — N907 Vulvar cyst: Secondary | ICD-10-CM

## 2024-06-29 ENCOUNTER — Ambulatory Visit: Admitting: Physician Assistant

## 2024-07-14 ENCOUNTER — Encounter: Payer: Self-pay | Admitting: Obstetrics & Gynecology

## 2024-07-17 ENCOUNTER — Ambulatory Visit
Admission: RE | Admit: 2024-07-17 | Discharge: 2024-07-17 | Disposition: A | Source: Ambulatory Visit | Attending: Obstetrics & Gynecology | Admitting: Obstetrics & Gynecology

## 2024-07-17 DIAGNOSIS — N907 Vulvar cyst: Secondary | ICD-10-CM

## 2024-07-20 ENCOUNTER — Encounter: Payer: Self-pay | Admitting: Physician Assistant

## 2024-07-20 ENCOUNTER — Ambulatory Visit: Admitting: Physician Assistant

## 2024-07-20 VITALS — BP 100/60 | HR 59 | Temp 97.5°F | Ht 64.0 in | Wt 156.8 lb

## 2024-07-20 DIAGNOSIS — N9089 Other specified noninflammatory disorders of vulva and perineum: Secondary | ICD-10-CM

## 2024-07-20 NOTE — Progress Notes (Signed)
 "  History of Present Illness:   Chief Complaint  Patient presents with   Mass    2rt side and 1 left On labia area  No pain     Discussed the use of AI scribe software for clinical note transcription with the patient, who gave verbal consent to proceed.  History of Present Illness   Heather Brewer is a 34 year old female who presents with persistent labial masses for further evaluation.  She reports labial masses present for over eight years. They enlarged slightly in the first two years then stayed stable. The masses are subcutaneous, movable, and soft, with two on the right (one larger, one smaller) and one smaller mass on the left.  Multiple OBGYNs previously palpated the masses and felt they were likely cysts, though no biopsy or ultrasound was done at that time. After the birth of her second daughter four months ago, she feels the masses may have increased in size and that a new one may have appeared.  An ultrasound on December 15 showed the masses are solid, not fluid-filled or cystic, and are described as indeterminate and nonvascular. One was reported as about the size of a small plum and another the size of a grape. A pelvic MRI over a year ago for unrelated left lower quadrant and pelvic pain reportedly showed no abnormal findings.  The masses are painless and do not affect daily activities except mild discomfort when riding a bike. Since her recent pregnancy she has noticed increased labial redness and itchiness and small pustule-like bumps in the same area. Multiple STD tests during her pregnancies have been negative.        Past Medical History:  Diagnosis Date   Anxiety    Anxiety about health 10/30/2022   Chest tightness    Gestational diabetes 2022   checked blood sugars regularly, no medications   Headache    Herpes    Nail abnormality    pale, ridges at time, nail-biter   Palpitations 05/11/2024   Pelvic floor dysfunction 06/30/2021   Formatting of this  note might be different from the original.  06/30/2021: Had triage visit for concern for PTL earlier in the month. Was ruled out. Eventually saw PFPT and diagnosed with pelvic floor dysfunction. Plan is to continue following with PFPT. Barbarann Basket, CNM     Vaginal delivery 08/2021     Social History[1]  Past Surgical History:  Procedure Laterality Date   NO PAST SURGERIES      Family History  Problem Relation Age of Onset   Anemia Mother    Healthy Father    Cancer Paternal Grandmother        SKIN   Cancer Paternal Grandfather    Breast cancer Neg Hx    Colon cancer Neg Hx     Allergies[2]  Current Medications:  Current Medications[3]   Review of Systems:   Negative unless otherwise specified per HPI.  Vitals:   Vitals:   07/20/24 1143  BP: 100/60  Pulse: (!) 59  Temp: (!) 97.5 F (36.4 C)  TempSrc: Temporal  SpO2: 99%  Weight: 156 lb 12.8 oz (71.1 kg)  Height: 5' 4 (1.626 m)     Body mass index is 26.91 kg/m.  Physical Exam:   Physical Exam Constitutional:      Appearance: Normal appearance. She is well-developed.  HENT:     Head: Normocephalic and atraumatic.  Eyes:     General: Lids are normal.  Extraocular Movements: Extraocular movements intact.     Conjunctiva/sclera: Conjunctivae normal.  Pulmonary:     Effort: Pulmonary effort is normal.  Musculoskeletal:        General: Normal range of motion.     Cervical back: Normal range of motion and neck supple.  Skin:    General: Skin is warm and dry.  Neurological:     Mental Status: She is alert and oriented to person, place, and time.  Psychiatric:        Attention and Perception: Attention and perception normal.        Mood and Affect: Mood normal.        Behavior: Behavior normal.        Thought Content: Thought content normal.        Judgment: Judgment normal.     Assessment and Plan:   Assessment and Plan    Indeterminate vulvar masses - Contacted radiology to review  previous MRI and ultrasound reports. - Requested radiology to reassess previous MRI images for masses. - I contacted Radiology today and spoke with them regarding ultrasound from last month and prior pelvic MRI from 04/2023. Radiologist stated that the masses were not visible on pelvic MRI from 04/2023. Recommendation is to order pelvic MRI w and without contrast for further evaluation. Will also refer for surgical evaluation.  I personally spent a total of 35 minutes in the care of the patient today including preparing to see the patient, getting/reviewing separately obtained history, performing a medically appropriate exam/evaluation, counseling and educating, placing orders, referring and communicating with other health care professionals, communicating results, and coordinating care.   Lucie Buttner, PA-C    [1]  Social History Tobacco Use   Smoking status: Never    Passive exposure: Never   Smokeless tobacco: Never  Vaping Use   Vaping status: Never Used  Substance Use Topics   Alcohol use: Not Currently    Comment: occasionally   Drug use: Never  [2]  Allergies Allergen Reactions   Mangifera Indica Hives   African Mango [Irvingia Gabonensis]    Flavoring Agent Hives   Mango Flavoring Agent (Non-Screening) Hives  [3]  Current Outpatient Medications:    Prenatal Vit-Fe Fumarate-FA (PRENATAL VITAMIN AND MINERAL) 28-0.8 MG TABS, Take by mouth., Disp: , Rfl:   "

## 2024-07-22 ENCOUNTER — Encounter: Payer: Self-pay | Admitting: Physician Assistant

## 2024-07-27 ENCOUNTER — Ambulatory Visit: Payer: Self-pay | Admitting: *Deleted

## 2024-07-27 ENCOUNTER — Ambulatory Visit: Admitting: Internal Medicine

## 2024-07-27 ENCOUNTER — Encounter: Payer: Self-pay | Admitting: Internal Medicine

## 2024-07-27 VITALS — BP 110/70 | HR 85 | Temp 98.0°F | Ht 64.0 in | Wt 157.0 lb

## 2024-07-27 DIAGNOSIS — D4959 Neoplasm of unspecified behavior of other genitourinary organ: Secondary | ICD-10-CM

## 2024-07-27 DIAGNOSIS — R2990 Unspecified symptoms and signs involving the nervous system: Secondary | ICD-10-CM | POA: Diagnosis not present

## 2024-07-27 DIAGNOSIS — R202 Paresthesia of skin: Secondary | ICD-10-CM | POA: Diagnosis not present

## 2024-07-27 LAB — BASIC METABOLIC PANEL WITH GFR
BUN: 21 mg/dL (ref 6–23)
CO2: 31 meq/L (ref 19–32)
Calcium: 10 mg/dL (ref 8.4–10.5)
Chloride: 102 meq/L (ref 96–112)
Creatinine, Ser: 0.74 mg/dL (ref 0.40–1.20)
GFR: 107.57 mL/min
Glucose, Bld: 109 mg/dL — ABNORMAL HIGH (ref 70–99)
Potassium: 3.9 meq/L (ref 3.5–5.1)
Sodium: 140 meq/L (ref 135–145)

## 2024-07-27 LAB — B12 AND FOLATE PANEL
Folate: >23.4 ng/mL
Vitamin B-12: 628 pg/mL (ref 211–911)

## 2024-07-27 NOTE — Telephone Encounter (Signed)
 Appt today with Dr. Boston Byers.

## 2024-07-27 NOTE — Telephone Encounter (Signed)
 Reason for Triage: yesterday started having left sided tingling/chills in her left arm only. Then started feeling it spread to the left side of her face.   Reason for Disposition  [1] Tingling (e.g., pins and needles) of the face, arm / hand, or leg / foot on one side of the body AND [2] present now  (Exceptions: Chronic/recurrent symptom lasting > 4 weeks; or from known cause, such as: bumped elbow, carpal tunnel, pinched nerve.)  Answer Assessment - Initial Assessment Questions Patient is concerned about sensation of goose bumps tingling of left arm and face that started late yesterday and has continue into the morning. Patient is 4 months post partum and does not want to go to ED with her baby. Patient denies all other symptoms.    1. SYMPTOM: What is the main symptom you are concerned about? (e.g., weakness, numbness)     Sensation of tingling goose bumps in are/face- left side 2. ONSET: When did this start? (e.g., minutes, hours, days; while sleeping)     Started last night at 6pm 3. LAST NORMAL: When was the last time you (the patient) were normal (no symptoms)?     yesterday 4. PATTERN Does this come and go, or has it been constant since it started?  Is it present now?     Constant- present now 5. CARDIAC SYMPTOMS: Have you had any of the following symptoms: chest pain, difficulty breathing, palpitations?     no 6. NEUROLOGIC SYMPTOMS: Have you had any of the following symptoms: headache, dizziness, vision loss, double vision, changes in speech, unsteady on your feet?     no 7. OTHER SYMPTOMS: Do you have any other symptoms?     no 8. PREGNANCY: Is there any chance you are pregnant? When was your last menstrual period?     4 months post partum  Protocols used: Neurologic Deficit-A-AH

## 2024-07-27 NOTE — Progress Notes (Signed)
 ==============================  [facility]: [phone]   -- Medical Office Visit --  Patient: Heather Brewer      Age: 34 y.o.       Sex:  female  Date:   07/27/2024 Today's Healthcare Provider: Bernardino KANDICE Cone, MD  ==============================   Chief Complaint: Pain (Left side arm feels like chili comes and goes left side of face as well last night pain not headaches no vision issues no dizziness or anything like that)  Discussed the use of AI scribe software for clinical note transcription with the patient, who gave verbal consent to proceed.  History of Present Illness 34 year old female who presents with left-sided tingling and paresthesia.  She experiences a tingling sensation on the left side of her body, specifically in her left arm and face, which began yesterday evening around 7 PM after organizing her pantry. The sensation is described as similar to chills or goosebumps, but constant, and is not accompanied by pain, numbness, or weakness. It is mostly in her left arm and occasionally in the left side of her face, around the ear and jaw area.  Initially, the symptoms were noticed after organizing her pantry and eating dinner. They subsided after a nap but returned during the night while she was pumping breast milk. The tingling persisted into the morning and has been consistent in her left arm, though it fluctuates in her face.  No vision changes, dizziness, or facial drooping. She reports that she does not have true numbness or loss of sensation, and she can feel touch on both sides of her body equally.  She is four months postpartum and attributes some of her symptoms to her posture while breastfeeding and pumping. She has been experiencing fatigue and stress, which she attributes to her postpartum status and caring for two young children.  She has not taken any specific medications for this issue but has used low-dose ibuprofen   in the past during breastfeeding with her doctor's approval. Background Reviewed: Problem List: has Generalized anxiety disorder; Normal labor; Postpartum care following vaginal delivery 9/4; Second degree perineal laceration; Acne vulgaris; and Palpitations on their problem list. Past Medical History:  has a past medical history of Anxiety, Anxiety about health (10/30/2022), Chest tightness, Gestational diabetes (2022), Headache, Herpes, Nail abnormality, Palpitations (05/11/2024), Pelvic floor dysfunction (06/30/2021), and Vaginal delivery (08/2021). Past Surgical History:   has a past surgical history that includes No past surgeries. Social History:   reports that she has never smoked. She has never been exposed to tobacco smoke. She has never used smokeless tobacco. She reports that she does not currently use alcohol. She reports that she does not use drugs. Family History:  family history includes Anemia in her mother; Cancer in her paternal grandfather and paternal grandmother; Healthy in her father. Allergies:  is allergic to cit group, african mango [irvingia gabonensis], flavoring agent, and mango flavoring agent (non-screening).   Medication Reconciliation: Current Outpatient Medications on File Prior to Visit  Medication Sig   Prenatal Vit-Fe Fumarate-FA (PRENATAL VITAMIN AND MINERAL) 28-0.8 MG TABS Take by mouth.   No current facility-administered medications on file prior to visit.  There are no discontinued medications.   Physical Exam:    07/27/2024    9:27 AM 07/20/2024   11:43 AM 06/02/2024    1:41 AM  Vitals with BMI  Height 5' 4 5' 4   Weight 157 lbs 156 lbs 13 oz   BMI 26.94 26.9  Systolic 110 100 876  Diastolic 70 60 88  Pulse 85 59 64  Vital signs reviewed.  Nursing notes reviewed. Weight trend reviewed. Physical Activity: Not on file   General Appearance:  No acute distress appreciable.   Well-groomed, healthy-appearing female.  Well proportioned with  no abnormal fat distribution.  Good muscle tone. Pulmonary:  Normal work of breathing at rest, no respiratory distress apparent. SpO2: 98 %  Musculoskeletal: All extremities are intact.  Neurological:  Awake, alert, oriented, and engaged.  No obvious focal neurological deficits or cognitive impairments.  Sensorium seems unclouded.   Speech is clear and coherent with logical content. Psychiatric:  Appropriate mood, pleasant and cooperative demeanor, thoughtful and engaged during the exam  Verbalized to patient: Physical Exam NECK: Neck symmetrical without masses or tenderness. Thyroid  slightly enlarged. MUSCULOSKELETAL: Shoulders symmetrical without masses. NEUROLOGICAL: Grip strength 5/5 bilaterally.  Results Radiology Labial ultrasound (2024-07-17): Three labial masses, non-cystic, indeterminate etiology, no characterization of benign or malignant features     07/20/2024   11:47 AM 04/23/2024   11:58 AM 03/26/2023    1:05 PM 10/31/2022    8:29 AM  PHQ 2/9 Scores  PHQ - 2 Score 0 0 0 0  PHQ- 9 Score 0  3  2      Data saved with a previous flowsheet row definition   Office Visit on 07/27/2024  Component Date Value Ref Range Status   Sodium 07/27/2024 140  135 - 145 mEq/L Final   Potassium 07/27/2024 3.9  3.5 - 5.1 mEq/L Final   Chloride 07/27/2024 102  96 - 112 mEq/L Final   CO2 07/27/2024 31  19 - 32 mEq/L Final   Glucose, Bld 07/27/2024 109 (H)  70 - 99 mg/dL Final   BUN 98/80/7973 21  6 - 23 mg/dL Final   Creatinine, Ser 07/27/2024 0.74  0.40 - 1.20 mg/dL Final   GFR 98/80/7973 107.57  >60.00 mL/min Final   Calcium 07/27/2024 10.0  8.4 - 10.5 mg/dL Final   Vitamin A-87 98/80/7973 628  211 - 911 pg/mL Final   Folate 07/27/2024 >23.4  >5.9 ng/mL Final  Office Visit on 04/23/2024  Component Date Value Ref Range Status   WBC 04/23/2024 6.5  4.0 - 10.5 K/uL Final   RBC 04/23/2024 4.59  3.87 - 5.11 Mil/uL Final   Hemoglobin 04/23/2024 13.3  12.0 - 15.0 g/dL Final   HCT  89/83/7974 39.8  36.0 - 46.0 % Final   MCV 04/23/2024 86.6  78.0 - 100.0 fl Final   MCHC 04/23/2024 33.5  30.0 - 36.0 g/dL Final   RDW 89/83/7974 13.5  11.5 - 15.5 % Final   Platelets 04/23/2024 309.0  150.0 - 400.0 K/uL Final   Neutrophils Relative % 04/23/2024 69.7  43.0 - 77.0 % Final   Lymphocytes Relative 04/23/2024 21.4  12.0 - 46.0 % Final   Monocytes Relative 04/23/2024 6.3  3.0 - 12.0 % Final   Eosinophils Relative 04/23/2024 2.1  0.0 - 5.0 % Final   Basophils Relative 04/23/2024 0.5  0.0 - 3.0 % Final   Neutro Abs 04/23/2024 4.5  1.4 - 7.7 K/uL Final   Lymphs Abs 04/23/2024 1.4  0.7 - 4.0 K/uL Final   Monocytes Absolute 04/23/2024 0.4  0.1 - 1.0 K/uL Final   Eosinophils Absolute 04/23/2024 0.1  0.0 - 0.7 K/uL Final   Basophils Absolute 04/23/2024 0.0  0.0 - 0.1 K/uL Final   Sodium 04/23/2024 140  135 - 145 mEq/L Final   Potassium 04/23/2024  4.3  3.5 - 5.1 mEq/L Final   Chloride 04/23/2024 103  96 - 112 mEq/L Final   CO2 04/23/2024 28  19 - 32 mEq/L Final   Glucose, Bld 04/23/2024 109 (H)  70 - 99 mg/dL Final   BUN 89/83/7974 23  6 - 23 mg/dL Final   Creatinine, Ser 04/23/2024 0.95  0.40 - 1.20 mg/dL Final   Total Bilirubin 04/23/2024 0.4  0.2 - 1.2 mg/dL Final   Alkaline Phosphatase 04/23/2024 76  39 - 117 U/L Final   AST 04/23/2024 27  0 - 37 U/L Final   ALT 04/23/2024 34  0 - 35 U/L Final   Total Protein 04/23/2024 7.2  6.0 - 8.3 g/dL Final   Albumin 89/83/7974 4.4  3.5 - 5.2 g/dL Final   GFR 89/83/7974 79.86  >60.00 mL/min Final   Calcium 04/23/2024 9.7  8.4 - 10.5 mg/dL Final   TSH 89/83/7974 0.71  0.35 - 5.50 uIU/mL Final   Iron 04/23/2024 86  42 - 145 ug/dL Final   Transferrin 89/83/7974 315.0  212.0 - 360.0 mg/dL Final   Saturation Ratios 04/23/2024 19.5 (L)  20.0 - 50.0 % Final   Ferritin 04/23/2024 15.8  10.0 - 291.0 ng/mL Final   TIBC 04/23/2024 441.0  250.0 - 450.0 mcg/dL Final   Magnesium 89/83/7974 2.0  1.5 - 2.5 mg/dL Final  Admission on 90/95/7974,  Discharged on 03/13/2024  Component Date Value Ref Range Status   HIV Screen 4th Generation wRfx 03/12/2024 Non Reactive  Non Reactive Final   WBC 03/12/2024 20.4 (H)  4.0 - 10.5 K/uL Final   RBC 03/12/2024 4.48  3.87 - 5.11 MIL/uL Final   Hemoglobin 03/12/2024 13.3  12.0 - 15.0 g/dL Final   HCT 90/95/7974 38.9  36.0 - 46.0 % Final   MCV 03/12/2024 86.8  80.0 - 100.0 fL Final   MCH 03/12/2024 29.7  26.0 - 34.0 pg Final   MCHC 03/12/2024 34.2  30.0 - 36.0 g/dL Final   RDW 90/95/7974 13.1  11.5 - 15.5 % Final   Platelets 03/12/2024 243  150 - 400 K/uL Final   nRBC 03/12/2024 0.0  0.0 - 0.2 % Final   RPR Ser Ql 03/12/2024 NON REACTIVE  NON REACTIVE Final   ABO/RH(D) 03/12/2024 O POS   Final   Antibody Screen 03/12/2024 NEG   Final   Sample Expiration 03/12/2024    Final                   Value:03/15/2024,2359 Performed at [facility], [address], [place], [place] [address]    WBC 03/13/2024 10.7 (H)  4.0 - 10.5 K/uL Final   RBC 03/13/2024 3.84 (L)  3.87 - 5.11 MIL/uL Final   Hemoglobin 03/13/2024 11.4 (L)  12.0 - 15.0 g/dL Final   HCT 90/94/7974 33.9 (L)  36.0 - 46.0 % Final   MCV 03/13/2024 88.3  80.0 - 100.0 fL Final   MCH 03/13/2024 29.7  26.0 - 34.0 pg Final   MCHC 03/13/2024 33.6  30.0 - 36.0 g/dL Final   RDW 90/94/7974 13.7  11.5 - 15.5 % Final   Platelets 03/13/2024 202  150 - 400 K/uL Final   nRBC 03/13/2024 0.0  0.0 - 0.2 % Final   Placenta donation bld collect 03/13/2024 Collected by Laboratory   Final  Admission on 03/07/2024, Discharged on 03/07/2024  Component Date Value Ref Range Status   POCT [place] Test 03/07/2024 Negative = intact amniotic membranes   Final  Rom Plus 03/07/2024 NEGATIVE   Final  Admission on 11/16/2023, Discharged on 11/16/2023  Component Date Value Ref Range Status   Color, Urine 11/16/2023 STRAW (A)  YELLOW Final   APPearance 11/16/2023 CLEAR  CLEAR Final   Specific Gravity, Urine 11/16/2023 1.003 (L)  1.005 - 1.030 Final   pH  11/16/2023 7.0  5.0 - 8.0 Final   Glucose, UA 11/16/2023 NEGATIVE  NEGATIVE mg/dL Final   Hgb urine dipstick 11/16/2023 NEGATIVE  NEGATIVE Final   Bilirubin Urine 11/16/2023 NEGATIVE  NEGATIVE Final   Ketones, ur 11/16/2023 NEGATIVE  NEGATIVE mg/dL Final   Protein, ur 94/89/7974 NEGATIVE  NEGATIVE mg/dL Final   Nitrite 94/89/7974 NEGATIVE  NEGATIVE Final   Leukocytes,Ua 11/16/2023 NEGATIVE  NEGATIVE Final  Office Visit on 10/31/2022  Component Date Value Ref Range Status   WBC 10/31/2022 6.0  4.0 - 10.5 K/uL Final   RBC 10/31/2022 4.93  3.87 - 5.11 Mil/uL Final   Hemoglobin 10/31/2022 14.5  12.0 - 15.0 g/dL Final   HCT 95/75/7975 42.9  36.0 - 46.0 % Final   MCV 10/31/2022 87.0  78.0 - 100.0 fl Final   MCHC 10/31/2022 33.7  30.0 - 36.0 g/dL Final   RDW 95/75/7975 13.2  11.5 - 15.5 % Final   Platelets 10/31/2022 292.0  150.0 - 400.0 K/uL Final   Neutrophils Relative % 10/31/2022 70.0  43.0 - 77.0 % Final   Lymphocytes Relative 10/31/2022 20.6  12.0 - 46.0 % Final   Monocytes Relative 10/31/2022 7.3  3.0 - 12.0 % Final   Eosinophils Relative 10/31/2022 1.4  0.0 - 5.0 % Final   Basophils Relative 10/31/2022 0.7  0.0 - 3.0 % Final   Neutro Abs 10/31/2022 4.2  1.4 - 7.7 K/uL Final   Lymphs Abs 10/31/2022 1.2  0.7 - 4.0 K/uL Final   Monocytes Absolute 10/31/2022 0.4  0.1 - 1.0 K/uL Final   Eosinophils Absolute 10/31/2022 0.1  0.0 - 0.7 K/uL Final   Basophils Absolute 10/31/2022 0.0  0.0 - 0.1 K/uL Final   Sodium 10/31/2022 141  135 - 145 mEq/L Final   Potassium 10/31/2022 3.9  3.5 - 5.1 mEq/L Final   Chloride 10/31/2022 103  96 - 112 mEq/L Final   CO2 10/31/2022 28  19 - 32 mEq/L Final   Glucose, Bld 10/31/2022 101 (H)  70 - 99 mg/dL Final   BUN 95/75/7975 18  6 - 23 mg/dL Final   Creatinine, Ser 10/31/2022 0.88  0.40 - 1.20 mg/dL Final   Total Bilirubin 10/31/2022 0.5  0.2 - 1.2 mg/dL Final   Alkaline Phosphatase 10/31/2022 94  39 - 117 U/L Final   AST 10/31/2022 20  0 - 37 U/L Final    ALT 10/31/2022 15  0 - 35 U/L Final   Total Protein 10/31/2022 7.1  6.0 - 8.3 g/dL Final   Albumin 95/75/7975 4.5  3.5 - 5.2 g/dL Final   GFR 95/75/7975 88.45  >60.00 mL/min Final   Calcium 10/31/2022 9.6  8.4 - 10.5 mg/dL Final   Cholesterol 95/75/7975 132  0 - 200 mg/dL Final   Triglycerides 95/75/7975 74.0  0.0 - 149.0 mg/dL Final   HDL 95/75/7975 48.60  >39.00 mg/dL Final   VLDL 95/75/7975 14.8  0.0 - 40.0 mg/dL Final   LDL Cholesterol 10/31/2022 68  0 - 99 mg/dL Final   Total CHOL/HDL Ratio 10/31/2022 3   Final   NonHDL 10/31/2022 83.25   Final   TSH 10/31/2022 1.05  0.35 - 5.50  uIU/mL Final   Hgb A1c MFr Bld 10/31/2022 5.5  4.6 - 6.5 % Final  No image results found. US  PELVIS LIMITED (TRANSABDOMINAL ONLY) Result Date: 06/24/2024 EXAM: PELVIC ULTRASOUND TECHNIQUE: Transabdominal pelvic duplex ultrasound using B-mode/gray scaled imaging with Doppler spectral analysis and color flow was obtained. COMPARISON: None available. CLINICAL HISTORY: Vulvar cyst, history of mass in the right labia majora, soft tissue. FINDINGS: There is a hypoechoic heterogeneous nonvascular mass in the left labia measuring 2.0 x 0.4 x 1.0 cm. There are 2 similar appearing masses in the right labia measuring 3.0 x 0.7 x 0.9 cm and 3.1 x 0.5 x 1.5 cm. These do not appear cystic. There is no internal vascular flow identified. IMPRESSION: 1. Indeterminate heterogeneous nonvascular masses in the left labia (2.0 x 0.4 x 1.0 cm) and right labia (3.0 x 0.7 x 0.9 cm and 3.1 x 0.5 x 1.5 cm). Electronically signed by: Greig Pique MD 06/24/2024 08:02 PM EST RP Workstation: [id number]   LONG TERM MONITOR (3-14 DAYS) Result Date: 06/08/2024 7 Day Zio Monitor Quality: Fair.  Baseline artifact. Predominant rhythm: Average heart rate: 69 bpm Max heart rate: 134 bpm Min heart rate: 37 bpm Pauses >2.5 seconds: none Rare PACs and PVCs (<1%) No significant arrhythmias. Tiffany C. Raford, MD, [facility] 06/08/2024 5:47 PM         ASSESSMENT & PLAN   Assessment & Plan Paresthesias Neurological complaint Facial neurological complaint Paresthesias of the left arm and face (suspected brachial plexus injury)   Intermittent tingling in the left arm and face suggests a brachial plexus injury, consistent with peripheral nerve involvement rather than central nervous system issues. There are no stroke signs like numbness, weakness, or facial droop. Symptoms may be worsened by recent physical activity and posture changes. No fever or infection signs are present. Although thyroiditis is considered, it is less likely. Reassurance is provided that this is likely a benign peripheral nerve injury. She is advised to rest and allow natural healing, with B12 supplementation recommended to aid nerve recovery. Ibuprofen  is suggested for inflammation if needed, and gabapentin is discussed for symptom relief if necessary. Immediate MRI is not advised unless symptoms significantly worsen. Guidance is given on when to seek emergency care, such as in cases of true motor or sensory loss. An MRI of the brain with contrast is ordered to rule out other causes due to the presence of labial masses. Blood work including TSH, BMP, and B12 levels is also ordered. Vaginal tumor Labial masses (vaginal tumor)   Three masses in the labial area are identified on ultrasound, requiring further imaging for characterization. The previous ultrasound lacked contrast, possibly limiting its diagnostic capability. An MRI of the pelvis with a vaginal protocol is ordered to evaluate the labial masses, with scheduling coordinated with the brain MRI to minimize contrast exposure. The safety of MRI contrast for breastfeeding is discussed, noting minimal risk to the infant.  ORDER ASSOCIATIONS  #   DIAGNOSIS / CONDITION ICD-10 ENCOUNTER ORDER     ICD-10-CM   1. Paresthesias  R20.2 TSH Rfx on Abnormal to Free T4    Basic Metabolic Panel (BMET)    B12 and Folate Panel    MR  Brain W Wo Contrast    CANCELED: MR Brain W Wo Contrast    2. Vaginal tumor  D49.59 MR Pelvis W Wo Contrast    MR Brain W Wo Contrast    CANCELED: MR Brain W Wo Contrast    CANCELED: MR Pelvis W Wo  Contrast    3. Neurological complaint  R29.90 MR Brain W Wo Contrast    CANCELED: MR Brain W Wo Contrast    4. Facial neurological complaint  R29.90 MR Brain W Wo Contrast    CANCELED: MR Brain W Wo Contrast           Orders Placed in Encounter:   Lab Orders         TSH Rfx on Abnormal to Free T4         Basic Metabolic Panel (BMET)         A87 and Folate Panel     Imaging Orders         MR Pelvis W Wo Contrast         MR Brain W Wo Contrast     Medical Decision Making: 2 or more stable chronic illnesses 1 undiagnosed new problem with uncertain prognosis     Ordering of each unique test;  5     This document was synthesized by artificial intelligence (Abridge) using HIPAA-compliant recording of the clinical interaction;   We discussed the use of AI scribe software for clinical note transcription with the patient, who gave verbal consent to proceed. additional Info: This encounter employed state-of-the-art, real-time, collaborative documentation. The patient actively reviewed and assisted in updating their electronic medical record on a shared screen, ensuring transparency and facilitating joint problem-solving for the problem list, overview, and plan. This approach promotes accurate, informed care. The treatment plan was discussed and reviewed in detail, including medication safety, potential side effects, and all patient questions. We confirmed understanding and comfort with the plan. Follow-up instructions were established, including contacting the office for any concerns, returning if symptoms worsen, persist, or new symptoms develop, and precautions for potential emergency department visits.

## 2024-07-27 NOTE — Telephone Encounter (Signed)
 FYI Only or Action Required?: FYI only for provider: appointment scheduled on 1/19.  Patient was last seen in primary care on 07/20/2024 by Job Lukes, PA.  Called Nurse Triage reporting Neurologic Problem.  Symptoms began yesterday.  Interventions attempted: Rest, hydration, or home remedies.  Symptoms are: unchanged.  Triage Disposition: See HCP Within 4 Hours (Or PCP Triage)  Patient/caregiver understands and will follow disposition?: Yes

## 2024-07-27 NOTE — Patient Instructions (Signed)
 It was a pleasure seeing you today! Your health and satisfaction are our top priorities.  Heather Cone, MD  VISIT SUMMARY: During your visit, we discussed the tingling sensation you have been experiencing on the left side of your body, particularly in your left arm and face. We also addressed the labial masses identified in your previous ultrasound.  YOUR PLAN: -PARESTHESIAS OF THE LEFT ARM AND FACE (SUSPECTED BRACHIAL PLEXUS INJURY): Paresthesia refers to a tingling or prickling sensation, often described as 'pins and needles.' Your symptoms suggest a brachial plexus injury, which involves the network of nerves that send signals from your spine to your shoulder, arm, and hand. This is likely a benign condition that can be managed with rest and natural healing. We recommend B12 supplementation to support nerve recovery and ibuprofen  for inflammation if needed. Gabapentin can be considered for symptom relief if necessary. An MRI of the brain with contrast is ordered to rule out other causes, and blood work including TSH, BMP, and B12 levels will be done.  -LABIAL MASSES (VAGINAL TUMOR): Labial masses are growths in the vaginal area. We have identified three masses that require further imaging to better understand their nature. An MRI of the pelvis with a vaginal protocol is ordered to evaluate these masses. This will be scheduled along with your brain MRI to minimize contrast exposure. The safety of MRI contrast for breastfeeding has been discussed, and it poses minimal risk to your infant.  INSTRUCTIONS: Please follow up with the recommended MRI of the brain and pelvis, and complete the blood work as ordered. Seek emergency care if you experience true motor or sensory loss. Continue with B12 supplementation and use ibuprofen  for inflammation if needed. Gabapentin can be considered for symptom relief if necessary.  Your Providers PCP: Job Lukes, PA,  [phone]) Referring Provider: Job Lukes, [place],  [phone])  NEXT STEPS: [x]  Early Intervention: Schedule sooner appointment, call our on-call services, or go to emergency room if there is any significant Increase in pain or discomfort New or worsening symptoms Sudden or severe changes in your health [x]  Flexible Follow-Up: We recommend a No follow-ups on file. for optimal routine care. This allows for progress monitoring and treatment adjustments. [x]  Preventive Care: Schedule your annual preventive care visit! It's typically covered by insurance and helps identify potential health issues early. [x]  Lab & X-ray Appointments: Incomplete tests scheduled today, or call to schedule. X-rays: [facility] (M-F, 8:30am-noon or 1pm-5pm). [x]  Medical Information Release: Sign a release form at front desk to obtain relevant medical information we don't have.  MAKING THE MOST OF OUR FOCUSED 20 MINUTE APPOINTMENTS: [x]   Clearly state your top concerns at the beginning of the visit to focus our discussion [x]   If you anticipate you will need more time, please inform the front desk during scheduling - we can book multiple appointments in the same week. [x]   If you have transportation problems- use our convenient video appointments or ask about transportation support. [x]   We can get down to business faster if you use MyChart to update information before the visit and submit non-urgent questions before your visit. Thank you for taking the time to provide details through MyChart.  Let our nurse know and she can import this information into your encounter documents.  Arrival and Wait Times: [x]   Arriving on time ensures that everyone receives prompt attention. [x]   Early morning (8a) and afternoon (1p) appointments tend to have shortest wait times. [x]   Unfortunately, we  cannot delay appointments for late arrivals or hold slots during phone calls.  Getting Answers and Following Up [x]   Simple Questions & Concerns: For  quick questions or basic follow-up after your visit, reach us  at [phone] or MyChart messaging. [x]   Complex Concerns: If your concern is more complex, scheduling an appointment might be best. Discuss this with the staff to find the most suitable option. [x]   Lab & Imaging Results: We'll contact you directly if results are abnormal or you don't use MyChart. Most normal results will be on MyChart within 2-3 business days, with a review message from Dr. Jesus. Haven't heard back in 2 weeks? Need results sooner? Contact us  at [phone]. [x]   Referrals: Our referral coordinator will manage specialist referrals. The specialist's office should contact you within 2 weeks to schedule an appointment. Call us  if you haven't heard from them after 2 weeks.  Staying Connected [x]   MyChart: Activate your MyChart for the fastest way to access results and message us . See the last page of this paperwork for instructions on how to activate.  Bring to Your Next Appointment [x]   Medications: Please bring all your medication bottles to your next appointment to ensure we have an accurate record of your prescriptions. [x]   Health Diaries: If you're monitoring any health conditions at home, keeping a diary of your readings can be very helpful for discussions at your next appointment.  Billing [x]   X-ray & Lab Orders: These are billed by separate companies. Contact the invoicing company directly for questions or concerns. [x]   Visit Charges: Discuss any billing inquiries with our administrative services team.  Your Satisfaction Matters [x]   Share Your Experience: We strive for your satisfaction! If you have any complaints, or preferably compliments, please let Dr. Jesus know directly or contact our Practice Administrators, Manuelita Rubin or [employer], by asking at the front desk.   Reviewing Your Records [x]   Review this early draft of your clinical encounter notes below and the final encounter  summary tomorrow on MyChart after its been completed.  All orders placed so far are visible here: Paresthesias -     TSH Rfx on Abnormal to Free T4 -     Basic metabolic panel with GFR -     A87 and Folate Panel -     MR BRAIN W WO CONTRAST; Future  Vaginal tumor -     MR PELVIS W WO CONTRAST; Future -     MR BRAIN W WO CONTRAST; Future  Neurological complaint -     MR BRAIN W WO CONTRAST; Future  Facial neurological complaint -     MR BRAIN W WO CONTRAST; Future

## 2024-07-28 ENCOUNTER — Ambulatory Visit: Payer: Self-pay | Admitting: Internal Medicine

## 2024-07-28 LAB — TSH RFX ON ABNORMAL TO FREE T4: TSH: 0.899 u[IU]/mL (ref 0.450–4.500)

## 2024-07-28 NOTE — Addendum Note (Signed)
 Addended by: Zuly Belkin G on: 07/28/2024 07:48 AM   Modules accepted: Orders

## 2024-08-14 ENCOUNTER — Other Ambulatory Visit
